# Patient Record
Sex: Female | Born: 1980 | Race: White | Hispanic: No | Marital: Married | Smoking: Former smoker
Health system: Southern US, Community
[De-identification: ages and names within clinical notes are randomized; demographics above are authoritative.]

## PROBLEM LIST (undated history)

## (undated) DIAGNOSIS — H8109 Meniere's disease, unspecified ear: Secondary | ICD-10-CM

## (undated) DIAGNOSIS — J45909 Unspecified asthma, uncomplicated: Secondary | ICD-10-CM

## (undated) DIAGNOSIS — R87619 Unspecified abnormal cytological findings in specimens from cervix uteri: Secondary | ICD-10-CM

## (undated) DIAGNOSIS — D249 Benign neoplasm of unspecified breast: Secondary | ICD-10-CM

## (undated) DIAGNOSIS — B977 Papillomavirus as the cause of diseases classified elsewhere: Secondary | ICD-10-CM

## (undated) HISTORY — DX: Meniere's disease, unspecified ear: H81.09

## (undated) HISTORY — DX: Unspecified asthma, uncomplicated: J45.909

## (undated) HISTORY — DX: Benign neoplasm of unspecified breast: D24.9

## (undated) HISTORY — DX: Unspecified abnormal cytological findings in specimens from cervix uteri: R87.619

## (undated) HISTORY — DX: Papillomavirus as the cause of diseases classified elsewhere: B97.7

## (undated) HISTORY — PX: LEEP: SHX91

---
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
## 2001-05-19 ENCOUNTER — Other Ambulatory Visit: Admission: RE | Admit: 2001-05-19 | Discharge: 2001-05-19 | Payer: Self-pay | Admitting: *Deleted

## 2002-08-25 ENCOUNTER — Ambulatory Visit (HOSPITAL_COMMUNITY): Admission: RE | Admit: 2002-08-25 | Discharge: 2002-08-25 | Payer: Self-pay | Admitting: Otolaryngology

## 2003-08-08 ENCOUNTER — Other Ambulatory Visit: Admission: RE | Admit: 2003-08-08 | Discharge: 2003-08-08 | Payer: Self-pay | Admitting: *Deleted

## 2004-05-14 ENCOUNTER — Encounter (INDEPENDENT_AMBULATORY_CARE_PROVIDER_SITE_OTHER): Payer: Self-pay | Admitting: *Deleted

## 2004-05-14 ENCOUNTER — Ambulatory Visit (HOSPITAL_COMMUNITY): Admission: RE | Admit: 2004-05-14 | Discharge: 2004-05-14 | Payer: Self-pay | Admitting: *Deleted

## 2004-10-30 ENCOUNTER — Other Ambulatory Visit: Admission: RE | Admit: 2004-10-30 | Discharge: 2004-10-30 | Payer: Self-pay | Admitting: *Deleted

## 2005-12-16 ENCOUNTER — Other Ambulatory Visit: Admission: RE | Admit: 2005-12-16 | Discharge: 2005-12-16 | Payer: Self-pay | Admitting: Obstetrics & Gynecology

## 2007-02-02 ENCOUNTER — Other Ambulatory Visit: Admission: RE | Admit: 2007-02-02 | Discharge: 2007-02-02 | Payer: Self-pay | Admitting: Obstetrics and Gynecology

## 2010-08-22 NOTE — Op Note (Signed)
Crystal Alexander, Crystal Alexander             ACCOUNT NO.:  [id number]   MEDICAL RECORD NO.:  [id number]          PATIENT TYPE:  OIB   LOCATION:  NA                           FACILITY:  [facility]   PHYSICIAN:  Vikki Ports, MDDATE OF BIRTH:  02/25/1981   DATE OF PROCEDURE:  05/14/2004  DATE OF DISCHARGE:                                 OPERATIVE REPORT   PREOPERATIVE DIAGNOSIS:  Right breast mass x2.   POSTOPERATIVE DIAGNOSIS:  Right breast mass x2.   PROCEDURE:  Excisional right breast biopsy x2.   ANESTHESIA:  General.   SPECIMENS:  Right breast biopsy x2.   COMPLICATIONS:  None.   DESCRIPTION OF PROCEDURE:  The patient was taken to the operating room and  placed in the supine position.  After adequate general anesthesia was  induced using laryngeal mask, the right breast was prepped and draped in the  usual sterile fashion.  Using a curvilinear incision in the areolar edge in  the 12 o'clock region of the right breast, I dissected down onto a firm,  well-encapsulated mass.  It was grasped with a 2-0 Vicryl suture and excised  from surrounding tissue.  It was sent for pathologic evaluation.  Adequate  hemostasis was ensured and the skin was closed with subcuticular 4-0  Monocryl.   I then turned my attention to the more superior portion of the breast.  At  12 o'clock three fingerbreadths away from the areola, there was a  bilobulated mass.  Incision was made over it, dissected down.  It was  grasped with a 2-0 Vicryl and delivered up and through the wound with  electrocautery and excised.  Adequate hemostasis was ensured and the skin  was closed with subcuticular 4-0 Monocryl.  Steri-Strips and sterile  dressings were applied.  The patient tolerated the procedure well and went  to PACU in good condition.      KRH/MEDQ  D:  05/14/2004  T:  05/14/2004  Job:  [id number]

## 2012-07-18 ENCOUNTER — Encounter: Payer: Self-pay | Admitting: Certified Nurse Midwife

## 2012-07-19 ENCOUNTER — Encounter: Payer: Self-pay | Admitting: Certified Nurse Midwife

## 2012-07-19 ENCOUNTER — Ambulatory Visit (INDEPENDENT_AMBULATORY_CARE_PROVIDER_SITE_OTHER): Payer: 59 | Admitting: Certified Nurse Midwife

## 2012-07-19 VITALS — BP 110/64 | Ht 65.75 in | Wt 178.0 lb

## 2012-07-19 DIAGNOSIS — Z01419 Encounter for gynecological examination (general) (routine) without abnormal findings: Secondary | ICD-10-CM

## 2012-07-19 DIAGNOSIS — Z0000 Encounter for general adult medical examination without abnormal findings: Secondary | ICD-10-CM

## 2012-07-19 LAB — POCT URINALYSIS DIPSTICK
Blood, UA: NEGATIVE
Nitrite, UA: NEGATIVE
Urobilinogen, UA: NEGATIVE
pH, UA: 5

## 2012-07-19 NOTE — Patient Instructions (Addendum)

## 2012-07-19 NOTE — Progress Notes (Signed)
32 y.o. SingleCaucasian female   G0P0000 here for annual exam. Periods are regular every month, start light and then increase to moderate amount. 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  Being followed by ENT.  Does not desire cycle control for periods. No health issues today.  No std concerns.  Contraception not needed due to female/female relationship. Aware of TDap being due, plans to update later not today.   Patient's last menstrual period was 06/28/2012.          Sexually active: yes  The current method of family planning is none.    Exercising: yes  cardio & strength training Last mammogram: u/s 2006 with biopsy rt side Last pap: 05-15-11  Last BMD: none Alcohol: none Tobacco: none   Health Maintenance  Topic Date Due  . Pap Smear  10/07/1998  . Tetanus/tdap  10/07/1999  . Influenza Vaccine  12/05/2012    Family History  Problem Relation Age of Onset  . Hypertension Mother   . Parkinson's disease Mother   . Diabetes Father   . Hypertension Paternal Grandmother     There is no problem list on file for this patient.   Past Medical History  Diagnosis Date  . Asthma   . Meniere's disease   . Fibroadenoma of breast      breast biopsy x 2  . HPV (human papilloma virus) infection     Past Surgical History  Procedure Laterality Date  . Leep      Allergies: Review of patient's allergies indicates no known allergies.  Current Outpatient Prescriptions  Medication Sig Dispense Refill  . Calcium Carbonate-Vitamin D (CALCIUM + D PO) Take by mouth daily.      . fluticasone (FLOVENT HFA) 110 MCG/ACT inhaler Inhale 1 puff into the lungs daily.       No current facility-administered medications for this visit.    ROS: A comprehensive review of systems was negative.  Exam:    BP 110/64  Ht 5' 5.75" (1.67 m)  Wt 178 lb (80.74 kg)  BMI 28.95 kg/m2  LMP 06/28/2012 Weight change: @WEIGHTCHANGE @ Last 3 height recordings:  Ht Readings from Last 3  Encounters:  07/19/12 5' 5.75" (1.67 m)   General appearance: alert, cooperative and appears stated age Head: atraumatic Neck: no adenopathy, supple, symmetrical, trachea midline and thyroid not enlarged, symmetric, no tenderness/mass/nodules Lungs: clear to auscultation bilaterally Breasts: normal appearance, no masses or tenderness Heart: regular rate and rhythm Abdomen: soft, non-tender; bowel sounds normal; no masses,  no organomegaly Extremities: extremities normal, atraumatic, no cyanosis or edema Skin: Skin color, texture, turgor normal. No rashes or lesions Lymph nodes: Cervical, supraclavicular, and axillary nodes normal. no inguinal nodes palpated Neurologic: Alert and oriented X 3, normal strength and tone. Normal symmetric reflexes. Normal coordination and gait   Pelvic: External genitalia:  no lesions              Urethra: normal appearing urethra with no masses, tenderness or lesions              Bartholins and Skenes: Bartholin's, Urethra, Skene's normal                 Vagina: normal appearing vagina with normal color and discharge, no lesions              Cervix: normal leep appearance              Pap taken: yes        Bimanual  Exam:  Uterus:  uterus is normal size, shape, consistency and nontender, anteverted                                      Adnexa:    normal adnexa in size, nontender and no masses                                      Rectovaginal: Confirms                                      Anus:  normal sphincter tone, no lesions  A: Well woman exam Contraception none needed female/female relationship History of CIN2 with Leep     P:Reviewed health and wellness pertinent to exam:  mammogram yearly and pap smear as indicated Lab: TSH  return annually or prn      An After Visit Summary was printed and given to the patient.  Reviewed, TL

## 2012-07-20 ENCOUNTER — Telehealth: Payer: Self-pay | Admitting: Certified Nurse Midwife

## 2012-07-20 LAB — TSH: TSH: 1.17 u[IU]/mL (ref 0.350–4.500)

## 2012-07-20 NOTE — Telephone Encounter (Signed)
Patient returning call.

## 2012-07-20 NOTE — Telephone Encounter (Signed)
Pt was notified of normal tsh results

## 2012-07-21 LAB — IPS PAP TEST WITH REFLEX TO HPV

## 2013-02-09 ENCOUNTER — Other Ambulatory Visit: Payer: Self-pay

## 2013-07-24 ENCOUNTER — Ambulatory Visit (INDEPENDENT_AMBULATORY_CARE_PROVIDER_SITE_OTHER): Payer: 59 | Admitting: Certified Nurse Midwife

## 2013-07-24 ENCOUNTER — Encounter: Payer: Self-pay | Admitting: Certified Nurse Midwife

## 2013-07-24 VITALS — BP 100/68 | HR 68 | Resp 16 | Ht 65.75 in | Wt 182.0 lb

## 2013-07-24 DIAGNOSIS — Z01419 Encounter for gynecological examination (general) (routine) without abnormal findings: Secondary | ICD-10-CM

## 2013-07-24 DIAGNOSIS — Z0000 Encounter for general adult medical examination without abnormal findings: Secondary | ICD-10-CM

## 2013-07-24 NOTE — Patient Instructions (Signed)
General topics  Next pap or exam is  due in 1 year Take a Women's multivitamin Take 1200 mg. of calcium daily - prefer dietary If any concerns in interim to call back  Breast Self-Awareness Practicing breast self-awareness may pick up problems early, prevent significant medical complications, and possibly save your life. By practicing breast self-awareness, you can become familiar with how your breasts look and feel and if your breasts are changing. This allows you to notice changes early. It can also offer you some reassurance that your breast health is good. One way to learn what is normal for your breasts and whether your breasts are changing is to do a breast self-exam. If you find a lump or something that was not present in the past, it is best to contact your caregiver right away. Other findings that should be evaluated by your caregiver include nipple discharge, especially if it is bloody; skin changes or reddening; areas where the skin seems to be pulled in (retracted); or new lumps and bumps. Breast pain is seldom associated with cancer (malignancy), but should also be evaluated by a caregiver. BREAST SELF-EXAM The best time to examine your breasts is 5 7 days after your menstrual period is over.  ExitCare Patient Information 2013 [employer].   Exercise to Stay Healthy Exercise helps you become and stay healthy. EXERCISE IDEAS AND TIPS Choose exercises that:  You enjoy.  Fit into your day. You do not need to exercise really hard to be healthy. You can do exercises at a slow or medium level and stay healthy. You can:  Stretch before and after working out.  Try yoga, Pilates, or tai chi.  Lift weights.  Walk fast, swim, jog, run, climb stairs, bicycle, dance, or rollerskate.  Take aerobic classes. Exercises that burn about 150 calories:  Running 1  miles in 15 minutes.  Playing volleyball for 45 to 60 minutes.  Washing and waxing a car for 45 to 60  minutes.  Playing touch football for 45 minutes.  Walking 1  miles in 35 minutes.  Pushing a stroller 1  miles in 30 minutes.  Playing basketball for 30 minutes.  Raking leaves for 30 minutes.  Bicycling 5 miles in 30 minutes.  Walking 2 miles in 30 minutes.  Dancing for 30 minutes.  Shoveling snow for 15 minutes.  Swimming laps for 20 minutes.  Walking up stairs for 15 minutes.  Bicycling 4 miles in 15 minutes.  Gardening for 30 to 45 minutes.  Jumping rope for 15 minutes.  Washing windows or floors for 45 to 60 minutes. Document Released: 04/25/2010 Document Revised: 06/15/2011 Document Reviewed: 04/25/2010 ExitCare Patient Information 2013 [employer].   Other topics ( that may be useful information):    Sexually Transmitted Disease Sexually transmitted disease (STD) refers to any infection that is passed from person to person during sexual activity. This may happen by way of saliva, semen, blood, vaginal mucus, or urine. Common STDs include:  Gonorrhea.  Chlamydia.  Syphilis.  HIV/AIDS.  Genital herpes.  Hepatitis B and C.  Trichomonas.  Human papillomavirus (HPV).  Pubic lice. CAUSES  An STD may be spread by bacteria, virus, or parasite. A person can get an STD by:  Sexual intercourse with an infected person.  Sharing sex toys with an infected person.  Sharing needles with an infected person.  Having intimate contact with the genitals, mouth, or rectal areas of an infected person. SYMPTOMS  Some people may not have any symptoms, but   they can still pass the infection to others. Different STDs have different symptoms. Symptoms include:  Painful or bloody urination.  Pain in the pelvis, abdomen, vagina, anus, throat, or eyes.  Skin rash, itching, irritation, growths, or sores (lesions). These usually occur in the genital or anal area.  Abnormal vaginal discharge.  Penile discharge in men.  Soft, flesh-colored skin growths in the  genital or anal area.  Fever.  Pain or bleeding during sexual intercourse.  Swollen glands in the groin area.  Yellow skin and eyes (jaundice). This is seen with hepatitis. DIAGNOSIS  To make a diagnosis, your caregiver may:  Take a medical history.  Perform a physical exam.  Take a specimen (culture) to be examined.  Examine a sample of discharge under a microscope.  Perform blood test TREATMENT   Chlamydia, gonorrhea, trichomonas, and syphilis can be cured with antibiotic medicine.  Genital herpes, hepatitis, and HIV can be treated, but not cured, with prescribed medicines. The medicines will lessen the symptoms.  Genital warts from HPV can be treated with medicine or by freezing, burning (electrocautery), or surgery. Warts may come back.  HPV is a virus and cannot be cured with medicine or surgery.However, abnormal areas may be followed very closely by your caregiver and may be removed from the cervix, vagina, or vulva through office procedures or surgery. If your diagnosis is confirmed, your recent sexual partners need treatment. This is true even if they are symptom-free or have a negative culture or evaluation. They should not have sex until their caregiver says it is okay. HOME CARE INSTRUCTIONS  All sexual partners should be informed, tested, and treated for all STDs.  Take your antibiotics as directed. Finish them even if you start to feel better.  Only take over-the-counter or prescription medicines for pain, discomfort, or fever as directed by your caregiver.  Rest.  Eat a balanced diet and drink enough fluids to keep your urine clear or pale yellow.  Do not have sex until treatment is completed and you have followed up with your caregiver. STDs should be checked after treatment.  Keep all follow-up appointments, Pap tests, and blood tests as directed by your caregiver.  Only use latex condoms and water-soluble lubricants during sexual activity. Do not use  petroleum jelly or oils.  Avoid alcohol and illegal drugs.  Get vaccinated for HPV and hepatitis. If you have not received these vaccines in the past, talk to your caregiver about whether one or both might be right for you.  Avoid risky sex practices that can break the skin. The only way to avoid getting an STD is to avoid all sexual activity.Latex condoms and dental dams (for oral sex) will help lessen the risk of getting an STD, but will not completely eliminate the risk. SEEK MEDICAL CARE IF:   You have a fever.  You have any new or worsening symptoms. Document Released: 06/13/2002 Document Revised: 06/15/2011 Document Reviewed: 06/20/2010 [facility] Patient Information 2013 [place].    Domestic Abuse You are being battered or abused if someone close to you hits, pushes, or physically hurts you in any way. You also are being abused if you are forced into activities. You are being sexually abused if you are forced to have sexual contact of any kind. You are being emotionally abused if you are made to feel worthless or if you are constantly threatened. It is important to remember that help is available. No one has the right to abuse you. PREVENTION OF FURTHER  ABUSE  Learn the warning signs of danger. This varies with situations but may include: the use of alcohol, threats, isolation from friends and family, or forced sexual contact. Leave if you feel that violence is going to occur.  If you are attacked or beaten, report it to the police so the abuse is documented. You do not have to press charges. The police can protect you while you or the attackers are leaving. Get the officer's name and badge number and a copy of the report.  Find someone you can trust and tell them what is happening to you: your caregiver, a nurse, clergy member, close friend or family member. Feeling ashamed is natural, but remember that you have done nothing wrong. No one deserves abuse. Document Released:  03/20/2000 Document Revised: 06/15/2011 Document Reviewed: 05/29/2010 ExitCare Patient Information 2013 [employer].    How Much is Too Much Alcohol? Drinking too much alcohol can cause injury, accidents, and health problems. These types of problems can include:   Car crashes.  Falls.  Family fighting (domestic violence).  Drowning.  Fights.  Injuries.  Burns.  Damage to certain organs.  Having a baby with birth defects. ONE DRINK CAN BE TOO MUCH WHEN YOU ARE:  Working.  Pregnant or breastfeeding.  Taking medicines. Ask your doctor.  Driving or planning to drive. If you or someone you know has a drinking problem, get help from a doctor.  Document Released: 01/17/2009 Document Revised: 06/15/2011 Document Reviewed: 01/17/2009 ExitCare Patient Information 2013 [employer].   Smoking Hazards Smoking cigarettes is extremely bad for your health. Tobacco smoke has over 200 known poisons in it. There are over 60 chemicals in tobacco smoke that cause cancer. Some of the chemicals found in cigarette smoke include:   Cyanide.  Benzene.  Formaldehyde.  Methanol (wood alcohol).  Acetylene (fuel used in welding torches).  Ammonia. Cigarette smoke also contains the poisonous gases nitrogen oxide and carbon monoxide.  Cigarette smokers have an increased risk of many serious medical problems and Smoking causes approximately:  90% of all lung cancer deaths in men.  80% of all lung cancer deaths in women.  90% of deaths from chronic obstructive lung disease. Compared with nonsmokers, smoking increases the risk of:  Coronary heart disease by 2 to 4 times.  Stroke by 2 to 4 times.  Men developing lung cancer by 23 times.  Women developing lung cancer by 13 times.  Dying from chronic obstructive lung diseases by 12 times.  . Smoking is the most preventable cause of death and disease in our society.  WHY IS SMOKING ADDICTIVE?  Nicotine is the chemical  agent in tobacco that is capable of causing addiction or dependence.  When you smoke and inhale, nicotine is absorbed rapidly into the bloodstream through your lungs. Nicotine absorbed through the lungs is capable of creating a powerful addiction. Both inhaled and non-inhaled nicotine may be addictive.  Addiction studies of cigarettes and spit tobacco show that addiction to nicotine occurs mainly during the teen years, when young people begin using tobacco products. WHAT ARE THE BENEFITS OF QUITTING?  There are many health benefits to quitting smoking.   Likelihood of developing cancer and heart disease decreases. Health improvements are seen almost immediately.  Blood pressure, pulse rate, and breathing patterns start returning to normal soon after quitting. QUITTING SMOKING   American Lung Association - 1-800-LUNGUSA  American Cancer Society - 1-800-ACS-2345 Document Released: 04/30/2004 Document Revised: 06/15/2011 Document Reviewed: 01/02/2009 ExitCare Patient Information 2013 ExitCare,   LLC.   Stress Management Stress is a state of physical or mental tension that often results from changes in your life or normal routine. Some common causes of stress are:  Death of a loved one.  Injuries or severe illnesses.  Getting fired or changing jobs.  Moving into a new home. Other causes may be:  Sexual problems.  Business or financial losses.  Taking on a large debt.  Regular conflict with someone at home or at work.  Constant tiredness from lack of sleep. It is not just bad things that are stressful. It may be stressful to:  Win the lottery.  Get married.  Buy a new car. The amount of stress that can be easily tolerated varies from person to person. Changes generally cause stress, regardless of the types of change. Too much stress can affect your health. It may lead to physical or emotional problems. Too little stress (boredom) may also become stressful. SUGGESTIONS TO  REDUCE STRESS:  Talk things over with your family and friends. It often is helpful to share your concerns and worries. If you feel your problem is serious, you may want to get help from a professional counselor.  Consider your problems one at a time instead of lumping them all together. Trying to take care of everything at once may seem impossible. List all the things you need to do and then start with the most important one. Set a goal to accomplish 2 or 3 things each day. If you expect to do too many in a single day you will naturally fail, causing you to feel even more stressed.  Do not use alcohol or drugs to relieve stress. Although you may feel better for a short time, they do not remove the problems that caused the stress. They can also be habit forming.  Exercise regularly - at least 3 times per week. Physical exercise can help to relieve that "uptight" feeling and will relax you.  The shortest distance between despair and hope is often a good night's sleep.  Go to bed and get up on time allowing yourself time for appointments without being rushed.  Take a short "time-out" period from any stressful situation that occurs during the day. Close your eyes and take some deep breaths. Starting with the muscles in your face, tense them, hold it for a few seconds, then relax. Repeat this with the muscles in your neck, shoulders, hand, stomach, back and legs.  Take good care of yourself. Eat a balanced diet and get plenty of rest.  Schedule time for having fun. Take a break from your daily routine to relax. HOME CARE INSTRUCTIONS   Call if you feel overwhelmed by your problems and feel you can no longer manage them on your own.  Return immediately if you feel like hurting yourself or someone else. Document Released: 09/16/2000 Document Revised: 06/15/2011 Document Reviewed: 05/09/2007 [facility] Patient Information 2013 [place].  Have fun in [place]!! Debbi

## 2013-07-24 NOTE — Progress Notes (Signed)
33 y.o. G0P0000 Single Caucasian Fe here for annual exam. Periods normal, still long at 8-9 days. No partner change. No STD concerns. Getting ready for trip [place]! Had mastitis in right breast a few months ago. ? Piercing possible cause. Patient has removed ring in nipple now and has had no other problems. Recent visit with PCP for labs and aex, all normal per patient. Hearing loss stable now, no progression with Meniere's disease. No health issues today.  Patient's last menstrual period was 07/13/2013.          Sexually active: yes  The current method of family planning is none,female partner.    Exercising: yes  cardio Smoker:  no  Health Maintenance: Pap:  07-19-12 neg MMG:  U/s 2005 Colonoscopy:  none BMD:   none TDaP: 2007 Labs: none Self breast exam: done occ   reports that she has quit smoking. Her smoking use included Cigarettes. She smoked 0.00 packs per day. She does not have any smokeless tobacco history on file. She reports that she drinks about 1.5 ounces of alcohol per week. She reports that she does not use illicit drugs.  Past Medical History  Diagnosis Date  . Asthma   . Meniere's disease   . Fibroadenoma of breast      breast biopsy x 2  . HPV (human papilloma virus) infection   . Abnormal Pap smear of cervix     3/99 cin2 &3    Past Surgical History  Procedure Laterality Date  . Leep    . Cervical biopsy  w/ loop electrode excision  1999    negative margins    Current Outpatient Prescriptions  Medication Sig Dispense Refill  . Cholecalciferol (VITAMIN D PO) Take by mouth daily.      . fluticasone (FLOVENT HFA) 110 MCG/ACT inhaler Inhale 1 puff into the lungs daily.      . TURMERIC PO Take by mouth daily.      Marland Kitchen UNABLE TO FIND daily. Asthma clear       No current facility-administered medications for this visit.    Family History  Problem Relation Age of Onset  . Hypertension Mother   . Parkinson's disease Mother   . Diabetes Father   .  Hypertension Paternal Grandmother     ROS:  Pertinent items are noted in HPI.  Otherwise, a comprehensive ROS was negative.  Exam:   BP 100/68  Pulse 68  Resp 16  Ht 5' 5.75" (1.67 m)  Wt 182 lb (82.555 kg)  BMI 29.60 kg/m2  LMP 07/13/2013 Height: 5' 5.75" (167 cm)  Ht Readings from Last 3 Encounters:  07/24/13 5' 5.75" (1.67 m)  07/19/12 5' 5.75" (1.67 m)    General appearance: alert, cooperative and appears stated age Head: Normocephalic, without obvious abnormality, atraumatic Neck: no adenopathy, supple, symmetrical, trachea midline and thyroid normal to inspection and palpation and non-palpable Lungs: clear to auscultation bilaterally Breasts: normal appearance, no masses or tenderness, No nipple retraction or dimpling, No nipple discharge or bleeding, No axillary or supraclavicular adenopathy Heart: regular rate and rhythm Abdomen: soft, non-tender; no masses,  no organomegaly Extremities: extremities normal, atraumatic, no cyanosis or edema Skin: Skin color, texture, turgor normal. No rashes or lesions Lymph nodes: Cervical, supraclavicular, and axillary nodes normal. No abnormal inguinal nodes palpated Neurologic: Grossly normal   Pelvic: External genitalia:  no lesions              Urethra:  normal appearing urethra with no masses, tenderness or  lesions              Bartholin's and Skene's: normal                 Vagina: normal appearing vagina with normal color and discharge, no lesions              Cervix: normal, non tender              Pap taken: yes Bimanual Exam:  Uterus:  normal size, contour, position, consistency, mobility, non-tender and anteverted              Adnexa: normal adnexa and no mass, fullness, tenderness               Rectovaginal: Confirms               Anus:  Deferred,requested  A:  Well Woman with normal exam  Contraception none needed due to female/female relationship  Meniere's Disease with partial hearing loss  History of recent right  breast mastitis with no complications  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   pap smear taken today with HPVHR  counseled on breast self exam, mammography screening, STD prevention, HIV risk factors,Hepatitis C risk factors and prevention, adequate intake of calcium and vitamin D, diet and exercise  return annually or prn  An After Visit Summary was printed and given to the patient.

## 2013-07-27 LAB — IPS PAP TEST WITH HPV

## 2013-07-31 NOTE — Progress Notes (Signed)
Reviewed personally.  M. Suzanne Hoby Kawai, MD.  

## 2014-07-31 ENCOUNTER — Ambulatory Visit (INDEPENDENT_AMBULATORY_CARE_PROVIDER_SITE_OTHER): Payer: 59 | Admitting: Certified Nurse Midwife

## 2014-07-31 ENCOUNTER — Encounter: Payer: Self-pay | Admitting: Certified Nurse Midwife

## 2014-07-31 VITALS — BP 108/76 | HR 68 | Resp 16 | Ht 66.5 in | Wt 196.0 lb

## 2014-07-31 DIAGNOSIS — Z0000 Encounter for general adult medical examination without abnormal findings: Secondary | ICD-10-CM | POA: Diagnosis not present

## 2014-07-31 DIAGNOSIS — Z01419 Encounter for gynecological examination (general) (routine) without abnormal findings: Secondary | ICD-10-CM

## 2014-07-31 DIAGNOSIS — Z124 Encounter for screening for malignant neoplasm of cervix: Secondary | ICD-10-CM

## 2014-07-31 DIAGNOSIS — Z8742 Personal history of other diseases of the female genital tract: Secondary | ICD-10-CM

## 2014-07-31 LAB — CBC
HEMATOCRIT: 43.7 % (ref 36.0–46.0)
HEMOGLOBIN: 14.4 g/dL (ref 12.0–15.0)
MCH: 30 pg (ref 26.0–34.0)
MCHC: 33 g/dL (ref 30.0–36.0)
MCV: 91 fL (ref 78.0–100.0)
MPV: 10.2 fL (ref 8.6–12.4)
Platelets: 352 10*3/uL (ref 150–400)
RBC: 4.8 MIL/uL (ref 3.87–5.11)
RDW: 14.3 % (ref 11.5–15.5)
WBC: 7.9 10*3/uL (ref 4.0–10.5)

## 2014-07-31 LAB — POCT URINALYSIS DIPSTICK
Bilirubin, UA: NEGATIVE
Glucose, UA: NEGATIVE
Ketones, UA: NEGATIVE
LEUKOCYTES UA: NEGATIVE
Nitrite, UA: NEGATIVE
Protein, UA: NEGATIVE
UROBILINOGEN UA: NEGATIVE
pH, UA: 6

## 2014-07-31 NOTE — Progress Notes (Signed)
Reviewed personally.  M. Suzanne Merit Gadsby, MD.  

## 2014-07-31 NOTE — Progress Notes (Signed)
Reviewed personally.  M. Suzanne Jazon Jipson, MD.  

## 2014-07-31 NOTE — Patient Instructions (Signed)

## 2014-07-31 NOTE — Progress Notes (Signed)
34 y.o. G0P0000 Married  Caucasian Fe here for annual exam. Periods normal, with occasional period, normal for her. No partner change. Sees PCP prn for asthma.  No other health issues today. Went to [place] and [place]!   Patient's last menstrual period was 07/24/2014.          Sexually active: Yes.    The current method of family planning is none - female partner.     Exercising: Yes.    Cardio, strength  Smoker:  no  Health Maintenance: Pap: 07/24/13 Neg. 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 On menses.    reports that she has quit smoking. Her smoking use included Cigarettes. She has never used smokeless tobacco. She reports that she drinks about 1.5 oz of alcohol per week. She reports that she does not use illicit drugs.  Past Medical History  Diagnosis Date  . Asthma   . Meniere's disease   . Fibroadenoma of breast      breast biopsy x 2  . HPV (human papilloma virus) infection   . Abnormal Pap smear of cervix     3/99 cin2 &3    Past Surgical History  Procedure Laterality Date  . Leep    . Cervical biopsy  w/ loop electrode excision  1999    negative margins    Current Outpatient Prescriptions  Medication Sig Dispense Refill  . Calcium Carb-Cholecalciferol (CALCIUM 1000 + D PO) Take by mouth daily.    . Cholecalciferol (VITAMIN D PO) Take by mouth daily.    . fluticasone (FLOVENT HFA) 110 MCG/ACT inhaler Inhale 1 puff into the lungs daily.    Marland Kitchen glucosamine-chondroitin 500-400 MG tablet Take 1 tablet by mouth 3 (three) times daily.    . Maca Root POWD by Does not apply route daily.    . TURMERIC PO Take by mouth daily.    Marland Kitchen UNABLE TO FIND daily. Asthma clear     No current facility-administered medications for this visit.    Family History  Problem Relation Age of Onset  . Hypertension Mother   . Parkinson's disease Mother   . Diabetes Father   . Hypertension  Paternal Grandmother     ROS:  Pertinent items are noted in HPI.  Otherwise, a comprehensive ROS was negative.  Exam:   BP 108/76 mmHg  Pulse 68  Resp 16  Ht 5' 6.5" (1.689 m)  Wt 196 lb (88.905 kg)  BMI 31.16 kg/m2  LMP 07/24/2014 Height: 5' 6.5" (168.9 cm) Ht Readings from Last 3 Encounters:  07/31/14 5' 6.5" (1.689 m)  07/24/13 5' 5.75" (1.67 m)  07/19/12 5' 5.75" (1.67 m)    General appearance: alert, cooperative and appears stated age Head: Normocephalic, without obvious abnormality, atraumatic Neck: no adenopathy, supple, symmetrical, trachea midline and thyroid normal to inspection and palpation Lungs: clear to auscultation bilaterally Breasts: normal appearance, no masses or tenderness, No nipple retraction or dimpling, No nipple discharge or bleeding, No axillary or supraclavicular adenopathy Heart: regular rate and rhythm Abdomen: soft, non-tender; no masses,  no organomegaly Extremities: extremities normal, atraumatic, no cyanosis or edema Skin: Skin color, texture, turgor normal. No rashes or lesions, numerous tattoos Lymph nodes: Cervical, supraclavicular, and axillary nodes normal. No abnormal inguinal nodes palpated Neurologic: Grossly normal   Pelvic: External genitalia:  no lesions  Urethra:  normal appearing urethra with no masses, tenderness or lesions              Bartholin's and Skene's: normal                 Vagina: normal appearing vagina with normal color and discharge, no lesions              Cervix: normal, non tender, no lesions, LEEP appearance              Pap taken: Yes.   Bimanual Exam:  Uterus:  normal size, contour, position, consistency, mobility, non-tender              Adnexa: normal adnexa and no mass, fullness, tenderness               Rectovaginal: Confirms               Anus:  normal appearance  Chaperone present: Yes  A:  Well Woman with normal exam  Contraception female relationship  Screening labs  desired  History of abnormal pap CIN 2,3 with LEEP  P:   Reviewed health and wellness pertinent to exam  Lab: Lipid panel, Hep C  Pap smear taken today with HPV reflex   counseled on breast self exam, adequate intake of calcium and vitamin D, diet and exercise  return annually or prn  An After Visit Summary was printed and given to the patient.

## 2014-08-01 LAB — LIPID PANEL
CHOL/HDL RATIO: 3.5 ratio
Cholesterol: 202 mg/dL — ABNORMAL HIGH (ref 0–200)
HDL: 58 mg/dL (ref >=46)
LDL CALC: 127 mg/dL — AB (ref 0–99)
Triglycerides: 86 mg/dL (ref <150)
VLDL: 17 mg/dL (ref 0–40)

## 2014-08-01 LAB — HEPATITIS C ANTIBODY: HCV AB: NEGATIVE

## 2014-08-01 LAB — HEMOGLOBIN, FINGERSTICK: HEMOGLOBIN, FINGERSTICK: 14.4 g/dL (ref 12.0–16.0)

## 2014-08-02 LAB — IPS PAP TEST WITH REFLEX TO HPV

## 2015-08-07 ENCOUNTER — Ambulatory Visit (INDEPENDENT_AMBULATORY_CARE_PROVIDER_SITE_OTHER): Payer: 59 | Admitting: Certified Nurse Midwife

## 2015-08-07 ENCOUNTER — Encounter: Payer: Self-pay | Admitting: Certified Nurse Midwife

## 2015-08-07 VITALS — BP 116/70 | HR 70 | Resp 16 | Ht 66.5 in | Wt 203.0 lb

## 2015-08-07 DIAGNOSIS — L689 Hypertrichosis, unspecified: Secondary | ICD-10-CM | POA: Diagnosis not present

## 2015-08-07 DIAGNOSIS — Z124 Encounter for screening for malignant neoplasm of cervix: Secondary | ICD-10-CM | POA: Diagnosis not present

## 2015-08-07 DIAGNOSIS — Z01419 Encounter for gynecological examination (general) (routine) without abnormal findings: Secondary | ICD-10-CM

## 2015-08-07 DIAGNOSIS — N912 Amenorrhea, unspecified: Secondary | ICD-10-CM | POA: Diagnosis not present

## 2015-08-07 DIAGNOSIS — Z0000 Encounter for general adult medical examination without abnormal findings: Secondary | ICD-10-CM

## 2015-08-07 LAB — TSH: TSH: 2.26 m[IU]/L

## 2015-08-07 LAB — POCT URINALYSIS DIPSTICK
Bilirubin, UA: NEGATIVE
Blood, UA: NEGATIVE
Glucose, UA: NEGATIVE
Ketones, UA: NEGATIVE
LEUKOCYTES UA: NEGATIVE
Nitrite, UA: NEGATIVE
PH UA: 5
PROTEIN UA: NEGATIVE
UROBILINOGEN UA: NEGATIVE

## 2015-08-07 LAB — LIPID PANEL
Cholesterol: 235 mg/dL — ABNORMAL HIGH (ref 125–200)
HDL: 54 mg/dL (ref >=46)
LDL CALC: 153 mg/dL — AB (ref <130)
Total CHOL/HDL Ratio: 4.4 Ratio (ref <=5.0)
Triglycerides: 142 mg/dL (ref <150)
VLDL: 28 mg/dL (ref <30)

## 2015-08-07 NOTE — Patient Instructions (Signed)
EXERCISE AND DIET:  We recommended that you start or continue a regular exercise program for good health. Regular exercise means any activity that makes your heart beat faster and makes you sweat.  We recommend exercising at least 30 minutes per day at least 3 days a week, preferably 4 or 5.  We also recommend a diet low in fat and sugar.  Inactivity, poor dietary choices and obesity can cause diabetes, heart attack, stroke, and kidney damage, among others.    ALCOHOL AND SMOKING:  Women should limit their alcohol intake to no more than 7 drinks/beers/glasses of wine (combined, not each!) per week. Moderation of alcohol intake to this level decreases your risk of breast cancer and liver damage. And of course, no recreational drugs are part of a healthy lifestyle.  And absolutely no smoking or even second hand smoke. Most people know smoking can cause heart and lung diseases, but did you know it also contributes to weakening of your bones? Aging of your skin?  Yellowing of your teeth and nails?  CALCIUM AND VITAMIN D:  Adequate intake of calcium and Vitamin D are recommended.  The recommendations for exact amounts of these supplements seem to change often, but generally speaking 600 mg of calcium (either carbonate or citrate) and 800 units of Vitamin D per day seems prudent. Certain women may benefit from higher intake of Vitamin D.  If you are among these women, your doctor will have told you during your visit.    PAP SMEARS:  Pap smears, to check for cervical cancer or precancers,  have traditionally been done yearly, although recent scientific advances have shown that most women can have pap smears less often.  However, every woman still should have a physical exam from her gynecologist every year. It will include a breast check, inspection of the vulva and vagina to check for abnormal growths or skin changes, a visual exam of the cervix, and then an exam to evaluate the size and shape of the uterus and  ovaries.  And after 35 years of age, a rectal exam is indicated to check for rectal cancers. We will also provide age appropriate advice regarding health maintenance, like when you should have certain vaccines, screening for sexually transmitted diseases, bone density testing, colonoscopy, mammograms, etc.   MAMMOGRAMS:  All women over 40 years old should have a yearly mammogram. Many facilities now offer a "3D" mammogram, which may cost around $50 extra out of pocket. If possible,  we recommend you accept the option to have the 3D mammogram performed.  It both reduces the number of women who will be called back for extra views which then turn out to be normal, and it is better than the routine mammogram at detecting truly abnormal areas.    COLONOSCOPY:  Colonoscopy to screen for colon cancer is recommended for all women at age 50.  We know, you hate the idea of the prep.  We agree, BUT, having colon cancer and not knowing it is worse!!  Colon cancer so often starts as a polyp that can be seen and removed at colonscopy, which can quite literally save your life!  And if your first colonoscopy is normal and you have no family history of colon cancer, most women don't have to have it again for 10 years.  Once every ten years, you can do something that may end up saving your life, right?  We will be happy to help you get it scheduled when you are ready.    Be sure to check your insurance coverage so you understand how much it will cost.  It may be covered as a preventative service at no cost, but you should check your particular policy.     Secondary Amenorrhea Secondary amenorrhea is the stopping of menstrual flow for 3-6 months in a female who has previously had periods. There are many possible causes. Most of these causes are not serious. Usually, treating the underlying problem causing the loss of menses will return your periods to normal. CAUSES  Some common and uncommon causes of not menstruating  include:  Malnutrition.  Low blood sugar (hypoglycemia).  Polycystic ovary disease.  Stress or fear.  Breastfeeding.  Hormone imbalance.  Ovarian failure.  Medicines.  Extreme obesity.  Cystic fibrosis.  Low body weight or drastic weight reduction from any cause.  Early menopause.  Removal of ovaries or uterus.  Contraceptives.  Illness.  Long-term (chronic) illnesses.  Cushing syndrome.  Thyroid problems.  Birth control pills, patches, or vaginal rings for birth control. RISK FACTORS You may be at greater risk of secondary amenorrhea if:  You have a family history of this condition.  You have an eating disorder.  You do athletic training. DIAGNOSIS  A diagnosis is made by your health care provider taking a medical history and doing a physical exam. This will include a pelvic exam to check for problems with your reproductive organs. Pregnancy must be ruled out. Often, numerous blood tests are done to measure different hormones in the body. Urine testing may be done. Specialized exams (ultrasound, CT scan, MRI, or hysteroscopy) may have to be done as well as measuring the body mass index (BMI). TREATMENT  Treatment depends on the cause of the amenorrhea. If an eating disorder is present, this can be treated with an adequate diet and therapy. Chronic illnesses may improve with treatment of the illness. Amenorrhea may be corrected with medicines, lifestyle changes, or surgery. If the amenorrhea cannot be corrected, it is sometimes possible to create a false menstruation with medicines. HOME CARE INSTRUCTIONS  Maintain a healthy diet.  Manage weight problems.  Exercise regularly but not excessively.  Get adequate sleep.  Manage stress.  Be aware of changes in your menstrual cycle. Keep a record of when your periods occur. Note the date your period starts, how long it lasts, and any problems. SEEK MEDICAL CARE IF: Your symptoms do not get better with  treatment.   This information is not intended to replace advice given to you by your health care provider. Make sure you discuss any questions you have with your health care provider.   Document Released: 05/04/2006 Document Revised: 04/13/2014 Document Reviewed: 09/08/2012 Elsevier Interactive Patient Education [employer].

## 2015-08-07 NOTE — Progress Notes (Signed)
Reviewed personally.  M. Suzanne Josceline Chenard, MD.  

## 2015-08-07 NOTE — Progress Notes (Signed)
35 y.o. G0P0000 Married  Caucasian Fe here for annual exam. Periods history of irregular period. In last 6 months has occurred 3 times.  Working on diet with wife now. Sees PCP as needed. Desires screening labs today. Wife has noted small area in vagina that feels like small bb or pea please check. Patient has not noted any changes. Aware she has gained weight and that can also affect her periods. No other health issues today. Took trip to [place]!  Patient's last menstrual period was 05/16/2015 (lmp unknown).          Sexually active: Yes.    The current method of family planning is none, female partner.    Exercising: Yes.    cardio & weights Smoker:  no  Health Maintenance: Pap:  07-31-14 neg Hx of CINII & III-Leep MMG:  none Colonoscopy:  none BMD:   none TDaP:  2007, pt wants update today Shingles: no Pneumonia: no Hep C and HIV: Hep c neg 2016, HIV neg yrs ago Labs: poct urine-neg Self breast exam: done occ   reports that she has quit smoking. Her smoking use included Cigarettes. She has never used smokeless tobacco. She reports that she drinks about 0.6 - 1.2 oz of alcohol per week. She reports that she does not use illicit drugs.  Past Medical History  Diagnosis Date  . Asthma   . Meniere's disease   . Fibroadenoma of breast      breast biopsy x 2  . HPV (human papilloma virus) infection   . Abnormal Pap smear of cervix     3/99 cin2 &3    Past Surgical History  Procedure Laterality Date  . Leep    . Cervical biopsy  w/ loop electrode excision  1999    negative margins    Current Outpatient Prescriptions  Medication Sig Dispense Refill  . Cholecalciferol (VITAMIN D PO) Take by mouth daily.    . fluticasone (FLOVENT HFA) 110 MCG/ACT inhaler Inhale 1 puff into the lungs daily.    Marland Kitchen glucosamine-chondroitin 500-400 MG tablet Take 1 tablet by mouth 3 (three) times daily.    Blanchie Dessert Chiro-Inositol (OVASITOL PO) Take by mouth 2 (two) times daily.    . Maca Root  POWD by Does not apply route daily.    . TURMERIC PO Take by mouth daily.    Marland Kitchen UNABLE TO FIND daily. Asthma clear     No current facility-administered medications for this visit.    Family History  Problem Relation Age of Onset  . Hypertension Mother   . Parkinson's disease Mother   . Diabetes Father   . Hypertension Paternal Grandmother     ROS:  Pertinent items are noted in HPI.  Otherwise, a comprehensive ROS was negative.  Exam:   BP 116/70 mmHg  Pulse 70  Resp 16  Ht 5' 6.5" (1.689 m)  Wt 203 lb (92.08 kg)  BMI 32.28 kg/m2  LMP 05/16/2015 (LMP Unknown) Height: 5' 6.5" (168.9 cm) Ht Readings from Last 3 Encounters:  08/07/15 5' 6.5" (1.689 m)  07/31/14 5' 6.5" (1.689 m)  07/24/13 5' 5.75" (1.67 m)    General appearance: alert, cooperative and appears stated age Head: Normocephalic, without obvious abnormality, atraumatic Neck: no adenopathy, supple, symmetrical, trachea midline and thyroid normal to inspection and palpation Lungs: clear to auscultation bilaterally Breasts: normal appearance, no masses or tenderness, No nipple retraction or dimpling, No nipple discharge or bleeding, No axillary or supraclavicular adenopathy Heart: regular rate and rhythm  Abdomen: soft, non-tender; no masses,  no organomegaly Extremities: extremities normal, atraumatic, no cyanosis or edema Skin: Skin color, texture, turgor normal. No rashes or lesions Lymph nodes: Cervical, supraclavicular, and axillary nodes normal. No abnormal inguinal nodes palpated Neurologic: Grossly normal   Pelvic: External genitalia:  no lesions              Urethra:  normal appearing urethra with no masses, tenderness or lesions              Bartholin's and Skene's: normal                 Vagina: normal appearing vagina with normal color and discharge, no lesions, small area at 11 o'clock noted in upper vagina rugae appearance normal appearance, non tender              Cervix: normal,non tender, LEEP  appearance, no lesions              Pap taken: Yes.   Bimanual Exam:  Uterus:  normal size, contour, position, consistency, mobility, non-tender              Adnexa: normal adnexa and no mass, fullness, tenderness               Rectovaginal: Confirms               Anus:  normal sphincter tone, no lesions  Chaperone present: yes  A:  Well Woman with normal exam  Contraception none needed female partner  Amenorrhea  Increase facial hair growth  History of irregular cycles  History of CIN 2,3 with LEEP age 40  Screening labs  P:   Reviewed health and wellness pertinent to exam  Discussed need to evaluate amenorrhea even though history of, with no menses in past 3 months. Discussed thyroid, weight, and pituitary influence of menses. Questions addressed. Agreeable to labs. Discussed possible Provera challenge if no period by first of June. Patient  Agreeable and will advise.  Lab: TSH,FSH,Prolactin  Discussed this may also be a part of irregular cycle. Would recommend labs also to check. Patient has had laser removal with reoccurrence. Questions addressed as to etiology. Discussed family history,elevated testosterone level and response to chemicals can cause changes.  Lab: Testosterone, DHEA  Pap smear as above with HPVHR  Labs: Lipid panel, Vitamin D   counseled on breast self exam, adequate intake of calcium and vitamin D, diet and exercise  return annually or prn  An After Visit Summary was printed and given to the patient.

## 2015-08-08 LAB — PROLACTIN: PROLACTIN: 13.1 ng/mL

## 2015-08-08 LAB — DHEA-SULFATE: DHEA-SO4: 168 ug/dL (ref 23–266)

## 2015-08-08 LAB — TESTOSTERONE: TESTOSTERONE: 102 ng/dL

## 2015-08-08 LAB — FOLLICLE STIMULATING HORMONE: FSH: 8.6 m[IU]/mL

## 2015-08-09 LAB — TESTOSTERONE, TOTAL, LC/MS/MS: TESTOSTERONE, TOTAL, LC-MS-MS: 61 ng/dL — AB (ref 2–45)

## 2015-08-09 LAB — IPS PAP TEST WITH HPV

## 2015-08-16 ENCOUNTER — Other Ambulatory Visit: Payer: Self-pay | Admitting: Certified Nurse Midwife

## 2015-08-16 DIAGNOSIS — L689 Hypertrichosis, unspecified: Secondary | ICD-10-CM

## 2015-08-19 ENCOUNTER — Telehealth: Payer: Self-pay | Admitting: Certified Nurse Midwife

## 2015-08-19 DIAGNOSIS — L689 Hypertrichosis, unspecified: Secondary | ICD-10-CM

## 2015-08-19 NOTE — Telephone Encounter (Addendum)
Spoke with patient. Advised of message as seen below from [place] She is agreeable and verbalizes understanding. Advised she will need to be seen in the office for additional lab work. She is agreeable. Appointment scheduled for 08/21/2015 at 9 am. She is agreeable to date and time. Advised I will let Melvia Heaps CNM know that we have spoken and if she has any additional recommendations I will return call tomorrow. She is agreeable and verbalizes understanding.  Cc: Melvia Heaps CNM

## 2015-08-19 NOTE — Telephone Encounter (Signed)
Agree with plan 

## 2015-08-19 NOTE — Telephone Encounter (Signed)
Patient left a message on Friday stating she was returning a call to [place]. No open telephone notes and this is Regina Eck, CNM patient?

## 2015-08-19 NOTE — Telephone Encounter (Signed)
Please let the patient know that Jackelyn Poling will be back in the office tomorrow. She has a slightly elevated testosterone level (not worrisome), I suspect she has PCOS with her oligomenorrhea, hair growth and increased weight. It looks like Debbie added a 17 hydroxyprogesterone. Her lipids were slightly abnormal. She should have a HgbA1C ordered as well. PCOS increases her risk of diabetes. Please order or add this on.  She should eat a diet low in saturated fats and exercise regularly. Weight loss can help PCOS and her lipids.

## 2015-08-19 NOTE — Telephone Encounter (Signed)
Routing to [place] for review and advise as Melvia Heaps CNM is out of the office today. No open telephone note. Labs from 08/07/2015 have returned.

## 2015-08-19 NOTE — Telephone Encounter (Signed)
Patient states she is returning a call to DL. She ask that a detailed message be left if she is unable to speak with DL today.

## 2015-08-19 NOTE — Telephone Encounter (Signed)
Routing to [place] for review.  Cc: Melvia Heaps CNM

## 2015-08-20 ENCOUNTER — Telehealth: Payer: Self-pay

## 2015-08-20 ENCOUNTER — Other Ambulatory Visit: Payer: Self-pay | Admitting: Certified Nurse Midwife

## 2015-08-20 NOTE — Telephone Encounter (Signed)
Patient returning your call, okay to leave detailed message. Best # to reach: [phone]

## 2015-08-20 NOTE — Telephone Encounter (Signed)
Left detailed message at number provided [phone], okay per ROI. Advised Crystal Alexander CNM has reviewed results and agrees with Dr.Jertson's recommendations regarding additional lab work. Will need to keep appointment as scheduled (please see telephone encounter dated 08/19/2015). Advised of her normal pap and abnormal cholesterol levels. Advised she will need to begin Omega 3 fish oil 1000 IU daily and recheck cholesterol in 6 months. Requested a return call to the office to schedule 6 month lab recheck.

## 2015-08-20 NOTE — Telephone Encounter (Signed)
Left message to call Kaitlyn at [phone]. 

## 2015-08-20 NOTE — Telephone Encounter (Signed)
-----   Message from Regina Eck, CNM sent at 08/20/2015 10:47 AM EDT ----- Notify patient that her pap smear is negative, HPVHR not detected 02 Testosterone is elevated which can be the reason for increase hair growth, I would like to you to have a progesterone and Hgb A1-c test also, due to the period irregularity this is probably a hormone imbalance  which can occur with PCOS . The hair growth can be controlled depending on lab results. Order in please schedule.Marland Kitchen DHEA was normal Prolactin and TSH is normal FSH is normal Lipid panel showed elevated cholesterol at 235, previous 202, LDL harmful cholesterol is 153 previous 127 Work on weight loss, exercise and decrease cholesterol foods. Start on Omega 3 fish oil 1000 IU daily would recheck in 6  Months. Order in please schedule

## 2015-08-21 ENCOUNTER — Other Ambulatory Visit (INDEPENDENT_AMBULATORY_CARE_PROVIDER_SITE_OTHER): Payer: 59

## 2015-08-21 DIAGNOSIS — L689 Hypertrichosis, unspecified: Secondary | ICD-10-CM

## 2015-08-22 LAB — HEMOGLOBIN A1C
Hgb A1c MFr Bld: 5.4 % (ref <5.7)
MEAN PLASMA GLUCOSE: 108 mg/dL

## 2015-08-22 LAB — CORTISOL: CORTISOL PLASMA: 15.6 ug/dL

## 2015-08-25 LAB — 17-HYDROXYPROGESTERONE: 17-OH-PROGESTERONE, LC/MS/MS: 90 ng/dL

## 2015-08-26 LAB — INSULIN-LIKE GROWTH FACTOR
IGF-I, LC/MS: 79 ng/mL (ref 53–331)
Z-SCORE (FEMALE): -1.2 {STDV} (ref ?–2.0)

## 2015-08-27 NOTE — Telephone Encounter (Signed)
Melvia Heaps CNM. Okay to close encounter at this time?

## 2015-08-27 NOTE — Telephone Encounter (Signed)
Spoke with patient. Advised of message and lab results as seen below from Crystal Alexander. She is agreeable and verbalizes understanding. Patient does not want to schedule consult appointment with Dr.Miller at this time or 6 month lab recheck. States she will need to check her work calendar and return call to schedule both.  Routing to provider for final review. Patient agreeable to disposition. Will close encounter.

## 2015-08-27 NOTE — Telephone Encounter (Signed)
-----   Message from Regina Eck, CNM sent at 08/27/2015  1:19 PM EDT ----- Notify patient that her IGF is normal, Cortisol is normal Hgb A1-C is normal  17 Hydroxyprogesterone is normal Would like her to schedule consult with Dr. Sabra Heck regarding medication management of excessive hair growth change. Please schedule

## 2016-04-16 ENCOUNTER — Encounter: Payer: Self-pay | Admitting: Certified Nurse Midwife

## 2016-04-16 ENCOUNTER — Telehealth: Payer: Self-pay | Admitting: Certified Nurse Midwife

## 2016-04-16 NOTE — Telephone Encounter (Signed)
I agree with plan. She was to return for lab work last year and did not. I reviewed my note and do not see where we discussed she could call for Rx.

## 2016-04-16 NOTE — Telephone Encounter (Signed)
Patient wants to start birth control. She would like to see if this is something DL would call into a pharmacy for her.

## 2016-04-16 NOTE — Telephone Encounter (Signed)
Spoke with patient. Patient asking if prescription for birth control can be called into pharmacy? Advised patient last AEX 08/07/15, would need an OV with Melvia Heaps, CNM to evaluate and discuss options, patient declined. Patient states she would just wait until next AEX as insurance will not cover a visit. Patient states she is anxious to start OCP and thought since discussed in the past could be called in. Advised patient will review with Melvia Heaps, CNM and return call if any additional recommendations. Patient verbalizes understanding and is agreeable.  Routing to provider for final review. Patient is agreeable to disposition. Will close encounter.

## 2016-04-23 ENCOUNTER — Encounter: Payer: Self-pay | Admitting: Certified Nurse Midwife

## 2016-08-11 ENCOUNTER — Ambulatory Visit: Payer: 59 | Admitting: Certified Nurse Midwife

## 2018-10-24 ENCOUNTER — Encounter: Payer: Self-pay | Admitting: Certified Nurse Midwife

## 2018-12-22 ENCOUNTER — Other Ambulatory Visit: Payer: Self-pay

## 2018-12-23 NOTE — Progress Notes (Signed)
38 y.o. G0P0000 Married  Caucasian Fe here to re-establish gyn care and  for annual exam. Periods normal with Maca Root and Ovasitol daily use.Duration 8-9 days and moderate to light, mild cramping. So much better. Has decided not to go further with pregnancy with her wife. Went to initial consult. Aware she has gained weight. Stopped Metformin use and does not want to restart. Has weight gain of 18 pounds and plans to work on this. Sees Dr Radene Ou with [facility], manages Asthma. No other health issues today.  Patient's last menstrual period was 12/16/2018 (exact date).          Sexually active: Yes.    The current method of family planning is none. Female partner   Exercising: Yes.    bike Smoker:  no  Review of Systems  Constitutional:       Irregular cycles  HENT: Negative.   Eyes: Negative.   Respiratory: Negative.   Cardiovascular: Negative.   Gastrointestinal: Negative.   Genitourinary: Negative.   Musculoskeletal: Negative.   Skin: Negative.   Neurological: Negative.   Endo/Heme/Allergies: Negative.   Psychiatric/Behavioral: Negative.     Health Maintenance: Pap:  10/2017 neg per patient History of Abnormal Pap: LEEP MMG:  none Self Breast exams: occ Colonoscopy:  none BMD:   none TDaP:  2019 Shingles: no Pneumonia: no Hep C and HIV: hep c neg 2016 Labs: if needed.   reports that she has quit smoking. Her smoking use included cigarettes. She has never used smokeless tobacco. She reports current alcohol use of about 1.0 - 2.0 standard drinks of alcohol per week. She reports that she does not use drugs.  Past Medical History:  Diagnosis Date  . Abnormal Pap smear of cervix    3/99 cin2 &3  . Asthma   . Fibroadenoma of breast     breast biopsy x 2  . HPV (human papilloma virus) infection   . Meniere's disease     Past Surgical History:  Procedure Laterality Date  . CERVICAL BIOPSY  W/ LOOP ELECTRODE EXCISION  1999   negative margins  . LEEP       Current Outpatient Medications  Medication Sig Dispense Refill  . fluticasone (FLOVENT HFA) 110 MCG/ACT inhaler Inhale 1 puff into the lungs daily.    Blanchie Dessert Chiro-Inositol (OVASITOL PO) Take by mouth 2 (two) times daily.    . Maca Root POWD by Does not apply route daily.    . TURMERIC PO Take by mouth daily.     No current facility-administered medications for this visit.     Family History  Problem Relation Age of Onset  . Hypertension Mother   . Parkinson's disease Mother   . Diabetes Father   . Hypertension Paternal Grandmother     ROS:  Pertinent items are noted in HPI.  Otherwise, a comprehensive ROS was negative.  Exam:   BP 110/70   Pulse 68   Temp (!) 97.2 F (36.2 C) (Skin)   Resp 16   Ht 5' 6.25" (1.683 m)   Wt 221 lb (100.2 kg)   LMP 12/16/2018 (Exact Date)   BMI 35.40 kg/m  Height: 5' 6.25" (168.3 cm) Ht Readings from Last 3 Encounters:  12/26/18 5' 6.25" (1.683 m)  08/07/15 5' 6.5" (1.689 m)  07/31/14 5' 6.5" (1.689 m)    General appearance: alert, cooperative and appears stated age Head: Normocephalic, without obvious abnormality, atraumatic Neck: no adenopathy, supple, symmetrical, trachea midline and thyroid normal  to inspection and palpation Lungs: clear to auscultation bilaterally Breasts: normal appearance, no masses or tenderness, No nipple retraction or dimpling, No nipple discharge or bleeding, No axillary or supraclavicular adenopathy Heart: regular rate and rhythm Abdomen: soft, non-tender; no masses,  no organomegaly Extremities: extremities normal, atraumatic, no cyanosis or edema Skin: Skin color, texture, turgor normal. No rashes or lesions, numerous tattoos Lymph nodes: Cervical, supraclavicular, and axillary nodes normal. No abnormal inguinal nodes palpated Neurologic: Grossly normal   Pelvic: External genitalia:  no lesions, normal appearance              Urethra:  normal appearing urethra with no masses, tenderness or  lesions              Bartholin's and Skene's: normal                 Vagina: normal appearing vagina with normal color and discharge, no lesions              Cervix: no cervical motion tenderness, no lesions and normal appearance              Pap taken: Yes.   Bimanual Exam:  Uterus:  normal size, contour, position, consistency, mobility, non-tender and anteverted              Adnexa: normal adnexa and no mass, fullness, tenderness               Rectovaginal: Confirms               Anus:  normal sphincter tone, no lesions  Chaperone present: yes  A:  Well Woman with normal exam  Contraception none needed, (wife)  History of PCOS, declines metformin use  History of irregular cycles, but uses Maca and Ovasitol with normal cycles now  Weight gain   Screening labs  P:   Reviewed health and wellness pertinent to exam  Aware of concerns with irregular cycles and PCOS profile and glucose concerns, will check labs today.  Patient will call if cycles become irregular.  Plans to work on [employer] management and exercise.  Labs: Lipid panel, TSH, Hgb A1-C, CMP, CBC  Pap smear: yes   counseled on breast self exam, feminine hygiene, adequate intake of calcium and vitamin D, diet and exercise  return annually or prn  An After Visit Summary was printed and given to the patient.

## 2018-12-26 ENCOUNTER — Encounter: Payer: Self-pay | Admitting: Certified Nurse Midwife

## 2018-12-26 ENCOUNTER — Ambulatory Visit (INDEPENDENT_AMBULATORY_CARE_PROVIDER_SITE_OTHER): Payer: 59 | Admitting: Certified Nurse Midwife

## 2018-12-26 ENCOUNTER — Other Ambulatory Visit (HOSPITAL_COMMUNITY)
Admission: RE | Admit: 2018-12-26 | Discharge: 2018-12-26 | Disposition: A | Discharge status: Home / Self Care | Payer: 59 | Source: Ambulatory Visit | Attending: Certified Nurse Midwife | Admitting: Certified Nurse Midwife

## 2018-12-26 ENCOUNTER — Other Ambulatory Visit: Payer: Self-pay

## 2018-12-26 VITALS — BP 110/70 | HR 68 | Temp 97.2°F | Resp 16 | Ht 66.25 in | Wt 221.0 lb

## 2018-12-26 DIAGNOSIS — Z124 Encounter for screening for malignant neoplasm of cervix: Secondary | ICD-10-CM | POA: Insufficient documentation

## 2018-12-26 DIAGNOSIS — Z0000 Encounter for general adult medical examination without abnormal findings: Secondary | ICD-10-CM | POA: Diagnosis not present

## 2018-12-26 DIAGNOSIS — E559 Vitamin D deficiency, unspecified: Secondary | ICD-10-CM

## 2018-12-26 DIAGNOSIS — Z01419 Encounter for gynecological examination (general) (routine) without abnormal findings: Secondary | ICD-10-CM | POA: Diagnosis not present

## 2018-12-26 DIAGNOSIS — R635 Abnormal weight gain: Secondary | ICD-10-CM

## 2018-12-26 DIAGNOSIS — Z8742 Personal history of other diseases of the female genital tract: Secondary | ICD-10-CM

## 2018-12-26 NOTE — Patient Instructions (Signed)

## 2018-12-27 LAB — CBC
Hematocrit: 43 % (ref 34.0–46.6)
Hemoglobin: 14.6 g/dL (ref 11.1–15.9)
MCH: 29.5 pg (ref 26.6–33.0)
MCHC: 34 g/dL (ref 31.5–35.7)
MCV: 87 fL (ref 79–97)
Platelets: 366 10*3/uL (ref 150–450)
RBC: 4.95 x10E6/uL (ref 3.77–5.28)
RDW: 14.2 % (ref 11.7–15.4)
WBC: 8.8 10*3/uL (ref 3.4–10.8)

## 2018-12-27 LAB — COMPREHENSIVE METABOLIC PANEL
ALT: 19 IU/L (ref 0–32)
AST: 19 IU/L (ref 0–40)
Albumin/Globulin Ratio: 1.8 (ref 1.2–2.2)
Albumin: 4.5 g/dL (ref 3.8–4.8)
Alkaline Phosphatase: 88 IU/L (ref 39–117)
BUN/Creatinine Ratio: 13 (ref 9–23)
BUN: 10 mg/dL (ref 6–20)
Bilirubin Total: 0.3 mg/dL (ref 0.0–1.2)
CO2: 19 mmol/L — ABNORMAL LOW (ref 20–29)
Calcium: 9.5 mg/dL (ref 8.7–10.2)
Chloride: 103 mmol/L (ref 96–106)
Creatinine, Ser: 0.77 mg/dL (ref 0.57–1.00)
GFR calc Af Amer: 113 mL/min/{1.73_m2} (ref >59)
GFR calc non Af Amer: 98 mL/min/{1.73_m2} (ref >59)
Globulin, Total: 2.5 g/dL (ref 1.5–4.5)
Glucose: 102 mg/dL — ABNORMAL HIGH (ref 65–99)
Potassium: 4 mmol/L (ref 3.5–5.2)
Sodium: 139 mmol/L (ref 134–144)
Total Protein: 7 g/dL (ref 6.0–8.5)

## 2018-12-27 LAB — LIPID PANEL
Chol/HDL Ratio: 5.2 ratio — ABNORMAL HIGH (ref 0.0–4.4)
Cholesterol, Total: 225 mg/dL — ABNORMAL HIGH (ref 100–199)
HDL: 43 mg/dL (ref >39)
LDL Chol Calc (NIH): 142 mg/dL — ABNORMAL HIGH (ref 0–99)
Triglycerides: 223 mg/dL — ABNORMAL HIGH (ref 0–149)
VLDL Cholesterol Cal: 40 mg/dL (ref 5–40)

## 2018-12-27 LAB — HEMOGLOBIN A1C
Est. average glucose Bld gHb Est-mCnc: 105 mg/dL
Hgb A1c MFr Bld: 5.3 % (ref 4.8–5.6)

## 2018-12-27 LAB — VITAMIN D 25 HYDROXY (VIT D DEFICIENCY, FRACTURES): Vit D, 25-Hydroxy: 32.7 ng/mL (ref 30.0–100.0)

## 2018-12-27 LAB — TSH: TSH: 3.24 u[IU]/mL (ref 0.450–4.500)

## 2018-12-28 ENCOUNTER — Other Ambulatory Visit: Payer: Self-pay | Admitting: Certified Nurse Midwife

## 2018-12-28 ENCOUNTER — Telehealth: Payer: Self-pay

## 2018-12-28 DIAGNOSIS — R899 Unspecified abnormal finding in specimens from other organs, systems and tissues: Secondary | ICD-10-CM

## 2018-12-28 NOTE — Telephone Encounter (Signed)
-----   Message from Regina Eck, CNM sent at 12/28/2018  8:12 AM EDT ----- Notify patient her HGB A1-C is normal at 5.3 Liver, kidney profile essentially normal, Glucose borderline elevation at 102 but HGB A1-C was normal Lipid panel is elevated with Cholesterol at 225, triglycerides at 223, HDL at 43, LDL at 142. She had gained weight which sometimes changes the levels, but with risk ratio a 5.2 normal is up to 4.4 feel she needs to take this seriously and work on weight loss, change in diet with increase in fresh vegetables, lean protein, fish not fried, limited carbohydrates and soda or sweetened beverages. Regular exercise 4-5 times weekly. We can refer her dietary counseling if needed. Recheck Lipid panel in 3 months fasting. Start on Omega 3 fish oil daily order placed TSH is normal Vitamin D is normal at 32.7 CBC is normal Pap pending

## 2018-12-28 NOTE — Telephone Encounter (Signed)
Left message for call back.

## 2018-12-29 LAB — CYTOLOGY - PAP
Diagnosis: NEGATIVE
High risk HPV: NEGATIVE
Molecular Disclaimer: 56
Molecular Disclaimer: DETECTED
Molecular Disclaimer: NORMAL

## 2018-12-29 NOTE — Telephone Encounter (Signed)
Prefer she work with diet at this point. Red yeast rice does help, but it also can be harmful

## 2018-12-29 NOTE — Telephone Encounter (Signed)
Spoke with patient, advised per Melvia Heaps, CNM. Lab appt scheduled for 12/18 at 8:45am.   Patient request Melvia Heaps, CNM opinion on "Red Rice Yeast" supplement for lowering cholesterol?   Routing to Melvia Heaps, CNM  to review and advise.

## 2018-12-29 NOTE — Telephone Encounter (Signed)
Patient is returning a call to [place]. Crystal Alexander is out of the office today. She ask if someone else could please call her with her results?

## 2018-12-29 NOTE — Telephone Encounter (Signed)
Spoke with patient, advised as seen below per Deborah Leonard, CNM. Patient verbalizes understanding and is agreeable.   Encounter closed.  

## 2019-03-16 ENCOUNTER — Telehealth: Payer: Self-pay | Admitting: Certified Nurse Midwife

## 2019-03-16 NOTE — Telephone Encounter (Signed)
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 This prescribed by dermatology. I would encourage her to seek Dermatology evaluation. Dr. Delman Cheadle is a good option.

## 2019-03-16 NOTE — Telephone Encounter (Signed)
Henrene Alexander, Estella  [facility]  Phone Number: [phone]  Hi there! A friend of mine was telling me about Vaniqa for her hirsutism and that it has worked really well. I was wondering if this is something you could prescribe me for my issue due to my PCOS? I am due to come in for a cholesterol recheck on the 18th and wondered if I may pick it up then? Thanks so much in advance!   Crystal Alexander

## 2019-03-16 NOTE — Telephone Encounter (Signed)
Spoke with patient, advised per Melvia Heaps, CNM. Patient states she has seen Dr. Delman Cheadle in the past, does not want to see any additional providers during "pandemic", prefers to wait. Patient verbalizes understanding.   Routing to provider for final review. Patient is agreeable to disposition. Will close encounter.

## 2019-03-16 NOTE — Telephone Encounter (Signed)
Crystal Alexander, CNM - please review patient MyChart message and advise if OV needed to further discuss?

## 2019-03-24 ENCOUNTER — Other Ambulatory Visit: Payer: Self-pay

## 2019-06-13 NOTE — Telephone Encounter (Signed)
Spoke to pt. Pt plans to scheduled Lipid panel at PCP then to follow up with PCP for treatment.   Routing to D.Hollice Espy, CNM for review and will close encounter.

## 2019-06-23 ENCOUNTER — Encounter: Payer: Self-pay | Admitting: Certified Nurse Midwife

## 2019-12-27 ENCOUNTER — Ambulatory Visit: Payer: 59 | Admitting: Certified Nurse Midwife

## 2019-12-27 ENCOUNTER — Ambulatory Visit: Payer: 59 | Admitting: Obstetrics and Gynecology

## 2020-01-31 ENCOUNTER — Encounter (HOSPITAL_COMMUNITY): Payer: Self-pay | Admitting: *Deleted

## 2020-01-31 ENCOUNTER — Other Ambulatory Visit: Payer: Self-pay

## 2020-01-31 ENCOUNTER — Emergency Department (HOSPITAL_COMMUNITY)
Admission: EM | Admit: 2020-01-31 | Discharge: 2020-01-31 | Disposition: A | Discharge status: Home / Self Care | Payer: BC Managed Care – PPO | Attending: Emergency Medicine | Admitting: Emergency Medicine

## 2020-01-31 DIAGNOSIS — R002 Palpitations: Secondary | ICD-10-CM | POA: Insufficient documentation

## 2020-01-31 DIAGNOSIS — J45909 Unspecified asthma, uncomplicated: Secondary | ICD-10-CM | POA: Diagnosis not present

## 2020-01-31 DIAGNOSIS — Z7951 Long term (current) use of inhaled steroids: Secondary | ICD-10-CM | POA: Diagnosis not present

## 2020-01-31 DIAGNOSIS — Z87891 Personal history of nicotine dependence: Secondary | ICD-10-CM | POA: Insufficient documentation

## 2020-01-31 DIAGNOSIS — R000 Tachycardia, unspecified: Secondary | ICD-10-CM | POA: Diagnosis present

## 2020-01-31 DIAGNOSIS — D72829 Elevated white blood cell count, unspecified: Secondary | ICD-10-CM | POA: Diagnosis not present

## 2020-01-31 LAB — TROPONIN I (HIGH SENSITIVITY): Troponin I (High Sensitivity): <2 ng/L (ref <18)

## 2020-01-31 LAB — CBC WITH DIFFERENTIAL/PLATELET
Abs Immature Granulocytes: 0.09 10*3/uL — ABNORMAL HIGH (ref 0.00–0.07)
Basophils Absolute: 0.1 10*3/uL (ref 0.0–0.1)
Basophils Relative: 0 %
Eosinophils Absolute: 0.1 10*3/uL (ref 0.0–0.5)
Eosinophils Relative: 1 %
HCT: 43.1 % (ref 36.0–46.0)
Hemoglobin: 14.3 g/dL (ref 12.0–15.0)
Immature Granulocytes: 1 %
Lymphocytes Relative: 14 %
Lymphs Abs: 2.2 10*3/uL (ref 0.7–4.0)
MCH: 29.2 pg (ref 26.0–34.0)
MCHC: 33.2 g/dL (ref 30.0–36.0)
MCV: 88 fL (ref 80.0–100.0)
Monocytes Absolute: 1.5 10*3/uL — ABNORMAL HIGH (ref 0.1–1.0)
Monocytes Relative: 9 %
Neutro Abs: 12.3 10*3/uL — ABNORMAL HIGH (ref 1.7–7.7)
Neutrophils Relative %: 75 %
Platelets: 394 10*3/uL (ref 150–400)
RBC: 4.9 MIL/uL (ref 3.87–5.11)
RDW: 13.7 % (ref 11.5–15.5)
WBC: 16.2 10*3/uL — ABNORMAL HIGH (ref 4.0–10.5)
nRBC: 0 % (ref 0.0–0.2)

## 2020-01-31 LAB — BASIC METABOLIC PANEL
Anion gap: 11 (ref 5–15)
BUN: 13 mg/dL (ref 6–20)
CO2: 23 mmol/L (ref 22–32)
Calcium: 9.3 mg/dL (ref 8.9–10.3)
Chloride: 104 mmol/L (ref 98–111)
Creatinine, Ser: 0.8 mg/dL (ref 0.44–1.00)
GFR, Estimated: >60 mL/min (ref >60)
Glucose, Bld: 131 mg/dL — ABNORMAL HIGH (ref 70–99)
Potassium: 3.5 mmol/L (ref 3.5–5.1)
Sodium: 138 mmol/L (ref 135–145)

## 2020-01-31 MED ORDER — ACETAMINOPHEN 325 MG PO TABS: 650.0000 mg | ORAL_TABLET | Freq: Once | ORAL | Status: DC

## 2020-01-31 NOTE — ED Triage Notes (Signed)
Pt was in the car with significant other when she felt a "wave of heat" throughout her body and had a metallic taste in her mouth. She was fully vaccinated for Covid in April and received a booster at 1800 today. Pt states that she has felt a "fluttering" in her chest at times in the past two days, but she dismissed it as gas.

## 2020-01-31 NOTE — Discharge Instructions (Signed)
Recommend follow-up with your primary care doctor regarding your elevated white blood cell count.  If you develop chest pain, difficulty breathing or other new concerning symptom, return to ER for reassessment.

## 2020-01-31 NOTE — ED Provider Notes (Signed)
[facility] DEPT Provider Note   CSN: [id number] Arrival date & time: 01/31/20  2002     History Chief Complaint  Patient presents with  . Tachycardia    Crystal Alexander is a 39 y.o. female.  Presents to ER with concern for flush sensation.  Patient reports around 6:20 PM she received her [employer] booster.  Proximately 30 minutes later she felt a wave of heat over her body, chest.  Did not have any chest pain, no difficulty in breathing.  States that sensation slowly resolved.  At the time she noticed her heart rate was very elevated, up to 150 bpm according to her watch.  States that her symptoms have resolved.  She reports history of asthma, denies any other medical problems.  Former smoker, quit 7 or 8 years ago.  No personal history of DVT/PE.  No family history of CAD.  HPI     Past Medical History:  Diagnosis Date  . Abnormal Pap smear of cervix    3/99 cin2 &3  . Asthma   . Fibroadenoma of breast     breast biopsy x 2  . HPV (human papilloma virus) infection   . Meniere's disease     Patient Active Problem List   Diagnosis Date Noted  . Asthma 04/06/2001    Past Surgical History:  Procedure Laterality Date  . CERVICAL BIOPSY  W/ LOOP ELECTRODE EXCISION  1999   negative margins  . LEEP       OB History    Gravida  0   Para  0   Term  0   Preterm  0   AB  0   Living  0     SAB  0   TAB  0   Ectopic  0   Multiple  0   Live Births              Family History  Problem Relation Age of Onset  . Hypertension Mother   . Parkinson's disease Mother   . Diabetes Father   . Hypertension Paternal Grandmother     Social History   Tobacco Use  . Smoking status: Former Smoker    Types: Cigarettes  . Smokeless tobacco: Never Used  Substance Use Topics  . Alcohol use: Yes    Alcohol/week: 20.0 standard drinks    Types: 20 Cans of beer per week    Comment: in a month  . Drug use: No    Home  Medications Prior to Admission medications   Medication Sig Start Date End Date Taking? Authorizing Provider  fluticasone (FLOVENT HFA) 110 MCG/ACT inhaler Inhale 1 puff into the lungs daily.   Yes [provider]  metFORMIN (GLUCOPHAGE) 1000 MG tablet Take 1,000 mg by mouth 2 (two) times daily. 12/03/19  Yes [provider]    Allergies    Patient has no known allergies.  Review of Systems   Review of Systems  Constitutional: Negative for chills and fever.  HENT: Negative for ear pain and sore throat.   Eyes: Negative for pain and visual disturbance.  Respiratory: Negative for cough and shortness of breath.   Cardiovascular: Negative for chest pain and palpitations.  Gastrointestinal: Negative for abdominal pain and vomiting.  Genitourinary: Negative for dysuria and hematuria.  Musculoskeletal: Negative for arthralgias and back pain.  Skin: Negative for color change and rash.  Neurological: Negative for seizures and syncope.  All other systems reviewed and are negative.   Physical  Exam Updated Vital Signs BP (!) 146/86   Pulse (!) 120   Temp 97.6 F (36.4 C) (Oral)   Resp 14   Ht 5\' 6"  (1.676 m)   Wt 99.8 kg   SpO2 100%   BMI 35.51 kg/m   Physical Exam Vitals and nursing note reviewed.  Constitutional:      General: She is not in acute distress.    Appearance: She is well-developed.  HENT:     Head: Normocephalic and atraumatic.  Eyes:     Conjunctiva/sclera: Conjunctivae normal.  Cardiovascular:     Rate and Rhythm: Regular rhythm. Tachycardia present.     Heart sounds: No murmur heard.   Pulmonary:     Effort: Pulmonary effort is normal. No respiratory distress.     Breath sounds: Normal breath sounds.  Abdominal:     Palpations: Abdomen is soft.     Tenderness: There is no abdominal tenderness.  Musculoskeletal:        General: No deformity or signs of injury.     Cervical back: Neck supple.  Skin:    General: Skin is warm and dry.      Capillary Refill: Capillary refill takes less than 2 seconds.  Neurological:     General: No focal deficit present.     Mental Status: She is alert and oriented to person, place, and time.  Psychiatric:        Mood and Affect: Mood normal.        Behavior: Behavior normal.     ED Results / Procedures / Treatments   Labs (all labs ordered are listed, but only abnormal results are displayed) Labs Reviewed  CBC WITH DIFFERENTIAL/PLATELET - Abnormal; Notable for the following components:      Result Value   WBC 16.2 (*)    Neutro Abs 12.3 (*)    Monocytes Absolute 1.5 (*)    Abs Immature Granulocytes 0.09 (*)    All other components within normal limits  BASIC METABOLIC PANEL - Abnormal; Notable for the following components:   Glucose, Bld 131 (*)    All other components within normal limits  TROPONIN I (HIGH SENSITIVITY)    EKG EKG Interpretation  Date/Time:  Wednesday January 31 2020 21:15:04 EDT Ventricular Rate:  102 PR Interval:    QRS Duration: 92 QT Interval:  343 QTC Calculation: 447 R Axis:   55 Text Interpretation: Sinus tachycardia Borderline T abnormalities, anterior leads Confirmed by Madalyn Rob [phone]) on 01/31/2020 9:29:45 PM   Radiology No results found.  Procedures Procedures (including critical care time)  Medications Ordered in ED Medications  acetaminophen (TYLENOL) tablet 650 mg (has no administration in time range)    ED Course  I have reviewed the triage vital signs and the nursing notes.  Pertinent labs & imaging results that were available during my care of the patient were reviewed by me and considered in my medical decision making (see chart for details).    MDM Rules/Calculators/A&P                          39 year old lady presenting to the emergency room after she had a flushed sensation and episode of fast heart rate after receiving Covid vaccine this evening.  When I assessed patient, she had no ongoing symptoms.  No chest  pain or shortness of breath throughout the episodes.  EKG showing NSR without acute ischemic change, troponin within normal limits, doubt ACS.  Noted mild  leukocytosis, which may be related to recent vaccine dose.  Discussed this with patient and recommended this be rechecked in the next week or 2 by primary care.  Given no ongoing symptoms, reassuring vitals, believe she can be discharged home.   After the discussed management above, the patient was determined to be safe for discharge.  The patient was in agreement with this plan and all questions regarding their care were answered.  ED return precautions were discussed and the patient will return to the ED with any significant worsening of condition.  Final Clinical Impression(s) / ED Diagnoses Final diagnoses:  Palpitations  Leukocytosis, unspecified type    Rx / DC Orders ED Discharge Orders    None       Lucrezia Starch, MD 01/31/20 [id number]

## 2020-07-11 ENCOUNTER — Telehealth (HOSPITAL_COMMUNITY): Payer: BC Managed Care – PPO | Admitting: Psychiatry

## 2020-07-12 ENCOUNTER — Encounter (HOSPITAL_COMMUNITY): Payer: Self-pay | Admitting: Psychiatry

## 2020-07-12 ENCOUNTER — Telehealth (INDEPENDENT_AMBULATORY_CARE_PROVIDER_SITE_OTHER): Payer: BC Managed Care – PPO | Admitting: Psychiatry

## 2020-07-12 DIAGNOSIS — F4001 Agoraphobia with panic disorder: Secondary | ICD-10-CM | POA: Diagnosis not present

## 2020-07-12 DIAGNOSIS — F411 Generalized anxiety disorder: Secondary | ICD-10-CM | POA: Diagnosis not present

## 2020-07-12 MED ORDER — DULOXETINE HCL 20 MG PO CPEP: 20.0000 mg | ORAL_CAPSULE | Freq: Every day | ORAL | 0 refills | Status: DC

## 2020-07-12 MED ORDER — ALPRAZOLAM 0.25 MG PO TABS: 0.2500 mg | ORAL_TABLET | Freq: Every day | ORAL | 0 refills | Status: DC | PRN

## 2020-07-12 NOTE — Progress Notes (Signed)
Psychiatric Initial Adult Assessment   Patient Identification: Crystal Alexander MRN:  [id number] Date of Evaluation:  07/12/2020 Referral Source: primary care Chief Complaint:  establish care, anxiety and panic attacks Visit Diagnosis:    ICD-10-CM   1. 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 GAD (generalized anxiety disorder)  F41.1    Virtual Visit via Video Note  I connected with [name] on 02/54/27 at 11:00 AM EDT by a video enabled telemedicine application and verified that I am speaking with the correct person using two identifiers.  Location: Patient: home Provider: home office   I discussed the limitations of evaluation and management by telemedicine and the availability of in person appointments. The patient expressed understanding and agreed to proceed.     I discussed the assessment and treatment plan with the patient. The patient was provided an opportunity to ask questions and all were answered. The patient agreed with the plan and demonstrated an understanding of the instructions.   The patient was advised to call back or seek an in-person evaluation if the symptoms worsen or if the condition fails to improve as anticipated.  I provided 45 minutes of non-face-to-face time during this encounter including compiling and documentation.   History of Present Illness: Patient is a 40 years old married Caucasian female she works as a Geophysicist/field seismologist.  Her parents live in the same street she takes care of her mom who has parkinsonism.  Referred by primary care physician for establish care for anxiety and panic attacks  Patient endorses long history of having anxiety and panic attacks including generalized anxiety excessive worries at times unreasonable worries.  She has had her first panic attack at age 38 she was feeling gaggy and nervous.  She started taking treatment around her age 51s by her primary care physician for anxiety she has had suffered from social  anxiety.  She has had a fibroid surgery and after that she had panic attacks and has been on Paxil and Zoloft in the past but she did not like it it made her feel strange.  She has taken Xanax small dose of 0.25 mg in the past and that has helped  There was a time that she did not have panic attack for a while but after pandemic she was not working that much considering now she is back but still has to wear a mask she still has panic attacks and has to excuse from her clients saying that she has to run for coffee.  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  She worries about having a panic attack recently in the last couple of months they are happening more frequently she has apprehension of panic attack and also feels stuck if she is in traffic or driving a car.  She also has fears of close spaces including [employer] or alleviators at times.  She has been doing exercise and keeping self busy in the past but recently during the Covid it has slowed down.  She takes care of mom who has parkinsonism and cerebral palsy.  Patient over generalizes things and also has asthma sometimes she feels her anxiety worsens her asthma and then she starts worrying about what if my panic attack would make my asthma worsen this leads to another cycle of anxiety and panic.  Her wife is supportive.  Patient does not endorse psychotic symptoms delusions hallucinations or paranoia  Patient describes herself otherwise to be happy person and not having hopelessness or depressive symptoms  She is being evaluated for some inflammatory process when she got her blood work done as of exposure of possible autoimmune disease that is also making her anxious   Aggravating factors; pandemic, mom has parkinsonism.  Medical comorbidity Modifying factors her wife 8 years of marriage exercise regimen  Patient has used CBD oil at times for sleep denies using alcohol or any other street drug on a regular basis not using alcohol  either    Past Psychiatric History: anxiety, panic attacks  Previous Psychotropic Medications: Yes   Substance Abuse History in the last 12 months:  No.  Consequences of Substance Abuse: NA  Past Medical History:  Past Medical History:  Diagnosis Date  . Abnormal Pap smear of cervix    3/99 cin2 &3  . Asthma   . Fibroadenoma of breast     breast biopsy x 2  . HPV (human papilloma virus) infection   . Meniere's disease     Past Surgical History:  Procedure Laterality Date  . CERVICAL BIOPSY  W/ LOOP ELECTRODE EXCISION  1999   negative margins  . LEEP      Family Psychiatric History: mom: anxiety  Family History:  Family History  Problem Relation Age of Onset  . Hypertension Mother   . Parkinson's disease Mother   . Diabetes Father   . Hypertension Paternal Grandmother     Social History:   Social History   Socioeconomic History  . Marital status: Married    Spouse name: Not on file  . Number of children: Not on file  . Years of education: Not on file  . Highest education level: Not on file  Occupational History  . Not on file  Tobacco Use  . Smoking status: Former Smoker    Types: Cigarettes  . Smokeless tobacco: Never Used  Substance and Sexual Activity  . Alcohol use: Yes    Alcohol/week: 20.0 standard drinks    Types: 20 Cans of beer per week    Comment: in a month  . Drug use: No  . Sexual activity: Yes    Partners: Female    Birth control/protection: None    Comment: Female partner  Other Topics Concern  . Not on file  Social History Narrative  . Not on file   Social Determinants of Health   Financial Resource Strain: Not on file  Food Insecurity: Not on file  Transportation Needs: Not on file  Physical Activity: Not on file  Stress: Not on file  Social Connections: Not on file    Additional Social History: grew up with parents, sister, dad was somewhat harsh  Mom had anxiety. Did well in school, had anxiety when younger    Allergies:  No Known Allergies  Metabolic Disorder Labs: Lab Results  Component Value Date   HGBA1C 5.3 12/26/2018   MPG 108 08/21/2015   Lab Results  Component Value Date   PROLACTIN 13.1 08/07/2015   Lab Results  Component Value Date   CHOL 225 (H) 12/26/2018   TRIG 223 (H) 12/26/2018   HDL 43 12/26/2018   CHOLHDL 5.2 (H) 12/26/2018   VLDL 28 08/07/2015   LDLCALC 142 (H) 12/26/2018   LDLCALC 153 (H) 08/07/2015   Lab Results  Component Value Date   TSH 3.240 12/26/2018    Therapeutic Level Labs: No results found for: LITHIUM No results found for: CBMZ No results found for: VALPROATE  Current Medications: Current Outpatient Medications  Medication Sig Dispense Refill  . ALPRAZolam (XANAX) 0.25  MG tablet Take 1 tablet (0.25 mg total) by mouth daily as needed for anxiety. 15 tablet 0  . DULoxetine (CYMBALTA) 20 MG capsule Take 1 capsule (20 mg total) by mouth daily. 30 capsule 0  . fluticasone (FLOVENT HFA) 110 MCG/ACT inhaler Inhale 1 puff into the lungs daily.    . metFORMIN (GLUCOPHAGE) 1000 MG tablet Take 1,000 mg by mouth 2 (two) times daily.     No current facility-administered medications for this visit.      Psychiatric Specialty Exam: Review of Systems  Cardiovascular: Negative for chest pain.  Psychiatric/Behavioral: Negative for agitation and self-injury. The patient is nervous/anxious.     There were no vitals taken for this visit.There is no height or weight on file to calculate BMI.  General Appearance: Casual  Eye Contact:  Fair  Speech:  Clear and Coherent  Volume:  Normal  Mood:  Anxious  Affect:  Full Range  Thought Process:  Goal Directed  Orientation:  Full (Time, Place, and Person)  Thought Content:  Rumination  Suicidal Thoughts:  No  Homicidal Thoughts:  No  Memory:  Immediate;   Fair Recent;   Fair  Judgement:  Fair  Insight:  Fair  Psychomotor Activity:  Normal  Concentration:  Concentration: Fair and Attention Span: Fair   Recall:  Good  Fund of Knowledge:Good  Language: Good  Akathisia:  No  Handed:   AIMS (if indicated):  not done  Assets:  Communication Skills Desire for Improvement Financial Resources/Insurance Social Support  ADL's:  Intact  Cognition: WNL  Sleep:  variable   Screenings: PHQ2-9   Flowsheet Row Video Visit from 07/12/2020 in [facility]  PHQ-2 Total Score 0    Flowsheet Row Video Visit from 07/12/2020 in [place] No Risk      Assessment and Plan: as follows Panic disorder with agoraphobia; I do highly recommend psychotherapy talked about breathing techniques.  Distraction.  We will start Cymbalta considering she has some aches 20 mg small dose discussed and reviewed side effects added Xanax 0.25 mg small dose as needed for anxiety that is helped in the past discussed its risk and tolerance  Generalized anxiety disorder; treatment as above  Follow-up in 4 to 5 weeks or earlier if needed   Merian Capron, MD 4/8/202211:34 AM

## 2020-08-03 ENCOUNTER — Other Ambulatory Visit (HOSPITAL_COMMUNITY): Payer: Self-pay | Admitting: Psychiatry

## 2020-08-19 ENCOUNTER — Telehealth (INDEPENDENT_AMBULATORY_CARE_PROVIDER_SITE_OTHER): Payer: BC Managed Care – PPO | Admitting: Psychiatry

## 2020-08-19 ENCOUNTER — Encounter (HOSPITAL_COMMUNITY): Payer: Self-pay | Admitting: Psychiatry

## 2020-08-19 DIAGNOSIS — F411 Generalized anxiety disorder: Secondary | ICD-10-CM

## 2020-08-19 DIAGNOSIS — F4001 Agoraphobia with panic disorder: Secondary | ICD-10-CM | POA: Diagnosis not present

## 2020-08-19 NOTE — Progress Notes (Signed)
[facility] Follow up visit   Patient Identification: Crystal Alexander MRN:  [id number] Date of Evaluation:  08/19/2020 Referral Source: primary care Chief Complaint: follow up anxiety, panic attacks Visit Diagnosis:    ICD-10-CM   1. 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 GAD (generalized anxiety disorder)  F41.1    Virtual Visit via Video Note  I connected with Crystal Alexander on [birth year]/50 at  2:30 PM EDT by a video enabled telemedicine application and verified that I am speaking with the correct person using two identifiers.  Location: Patient: home Provider: home office   I discussed the limitations of evaluation and management by telemedicine and the availability of in person appointments. The patient expressed understanding and agreed to proceed.   I discussed the assessment and treatment plan with the patient. The patient was provided an opportunity to ask questions and all were answered. The patient agreed with the plan and demonstrated an understanding of the instructions.   The patient was advised to call back or seek an in-person evaluation if the symptoms worsen or if the condition fails to improve as anticipated.  I provided 12  minutes of non-face-to-face time during this encounter.     History of Present Illness: Patient is a 40 years old married Caucasian female she works as a Geophysicist/field seismologist.  Her parents live in the same street she takes care of her mom who has parkinsonism.  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  She is going through evaluation for autoimmune disease also she has polycystic disease and asthma that may also contribute to anxiety  She has been taking Xanax one fourth discussed to take half or full tablet 0.25 mg carry with her in case she needs for panic attacks  Her wife is supportive.  Aggravating factors; pandemic, mom has parkinsonism.  Medical comorbidity Modifying factors; her wife of 8 years     Past Psychiatric History: anxiety, panic attacks  Previous Psychotropic Medications: Yes   Substance Abuse History in the last 12 months:  No.  Consequences of Substance Abuse: NA  Past Medical History:  Past Medical History:  Diagnosis Date  . Abnormal Pap smear of cervix    3/99 cin2 &3  . Asthma   . Fibroadenoma of breast     breast biopsy x 2  . HPV (human papilloma virus) infection   . Meniere's disease     Past Surgical History:  Procedure Laterality Date  . CERVICAL BIOPSY  W/ LOOP ELECTRODE EXCISION  1999   negative margins  . LEEP      Family Psychiatric History: mom: anxiety  Family History:  Family History  Problem Relation Age of Onset  . Hypertension Mother   . Parkinson's disease Mother   . Diabetes Father   . Hypertension Paternal Grandmother     Social History:   Social History   Socioeconomic History  . Marital status: Married    Spouse name: Not on file  . Number of children: Not on file  . Years of education: Not on  file  . Highest education level: Not on file  Occupational History  . Not on file  Tobacco Use  . Smoking status: Former Smoker    Types: Cigarettes  . Smokeless tobacco: Never Used  Substance and Sexual Activity  . Alcohol use: Yes    Alcohol/week: 20.0 standard drinks    Types: 20 Cans of beer per week    Comment: in a month  . Drug use: No  . Sexual activity: Yes    Partners: Female    Birth control/protection: None    Comment: Female partner  Other Topics Concern  . Not on file  Social  History Narrative  . Not on file   Social Determinants of Health   Financial Resource Strain: Not on file  Food Insecurity: Not on file  Transportation Needs: Not on file  Physical Activity: Not on file  Stress: Not on file  Social Connections: Not on file     Allergies:  No Known Allergies  Metabolic Disorder Labs: Lab Results  Component Value Date   HGBA1C 5.3 12/26/2018   MPG 108 08/21/2015   Lab Results  Component Value Date   PROLACTIN 13.1 08/07/2015   Lab Results  Component Value Date   CHOL 225 (H) 12/26/2018   TRIG 223 (H) 12/26/2018   HDL 43 12/26/2018   CHOLHDL 5.2 (H) 12/26/2018   VLDL 28 08/07/2015   LDLCALC 142 (H) 12/26/2018   LDLCALC 153 (H) 08/07/2015   Lab Results  Component Value Date   TSH 3.240 12/26/2018    Therapeutic Level Labs: No results found for: LITHIUM No results found for: CBMZ No results found for: VALPROATE  Current Medications: Current Outpatient Medications  Medication Sig Dispense Refill  . ALPRAZolam (XANAX) 0.25 MG tablet Take 1 tablet (0.25 mg total) by mouth daily as needed for anxiety. 15 tablet 0  . DULoxetine (CYMBALTA) 20 MG capsule TAKE 1 CAPSULE BY MOUTH EVERY DAY 90 capsule 1  . fluticasone (FLOVENT HFA) 110 MCG/ACT inhaler Inhale 1 puff into the lungs daily.    . metFORMIN (GLUCOPHAGE) 1000 MG tablet Take 1,000 mg by mouth 2 (two) times daily.     No current facility-administered medications for this visit.      Psychiatric Specialty Exam: Review of Systems  Cardiovascular: Negative for chest pain.  Psychiatric/Behavioral: Negative for agitation and self-injury.    There were no vitals taken for this visit.There is no height or weight on file to calculate BMI.  General Appearance: Casual  Eye Contact:  Fair  Speech:  Clear and Coherent  Volume:  Normal  Mood: Some better  Affect:  Full Range  Thought Process:  Goal Directed  Orientation:  Full (Time, Place, and Person)  Thought Content:   Rumination  Suicidal Thoughts:  No  Homicidal Thoughts:  No  Memory:  Immediate;   Fair Recent;   Fair  Judgement:  Fair  Insight:  Fair  Psychomotor Activity:  Normal  Concentration:  Concentration: Fair and Attention Span: Fair  Recall:  Good  Fund of Knowledge:Good  Language: Good  Akathisia:  No  Handed:   AIMS (if indicated):  not done  Assets:  Communication Skills Desire for Improvement Financial Resources/Insurance Social Support  ADL's:  Intact  Cognition: WNL  Sleep:  variable   Screenings: PHQ2-9   Flowsheet Row Video Visit from 07/12/2020 in [place] [facility]  PHQ-2 Total Score 0    Flowsheet Row Video Visit from 08/19/2020 in [facility] Video Visit from 07/12/2020 in [facility] No Risk No Risk      Assessment and Plan: as follows Panic disorder with agoraphobia; some better still happens work on distraction she should take Xanax full tablet instead of one quarter.  Cymbalta is helping she does not want to change or adjust as of now discussed breathing techniques   Generalized anxiety disorder; doing somewhat better treatment as above  Follow-up in 2 months or earlier if needed   Merian Capron, MD 5/16/20222:43 PM

## 2020-08-21 ENCOUNTER — Ambulatory Visit (INDEPENDENT_AMBULATORY_CARE_PROVIDER_SITE_OTHER): Payer: BC Managed Care – PPO | Admitting: Licensed Clinical Social Worker

## 2020-08-21 DIAGNOSIS — F4001 Agoraphobia with panic disorder: Secondary | ICD-10-CM

## 2020-08-21 DIAGNOSIS — F411 Generalized anxiety disorder: Secondary | ICD-10-CM
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
## 2020-09-02 ENCOUNTER — Other Ambulatory Visit (HOSPITAL_COMMUNITY): Payer: Self-pay | Admitting: Psychiatry

## 2020-09-04 ENCOUNTER — Telehealth (HOSPITAL_COMMUNITY): Payer: Self-pay | Admitting: Psychiatry

## 2020-09-04 MED ORDER — BUSPIRONE HCL 7.5 MG PO TABS: 7.5000 mg | ORAL_TABLET | Freq: Every day | ORAL | 0 refills | Status: DC

## 2020-09-04 NOTE — Telephone Encounter (Signed)
Lm informing patient rx was sent to pharmacy.  Nothing Further Needed at this time.

## 2020-09-04 NOTE — Telephone Encounter (Signed)
Pt calling She would like to try buspar instead of the cymbalta. She is not having much relief from her panic attacks, and it is making her very sleepy.  She states she discussed this with you at the last visit. Are you ok with switching the medication?   [facility] [address]   cb [phone]

## 2020-09-04 NOTE — Telephone Encounter (Signed)
Ok I have sent low dose buspar she can start

## 2020-09-10 ENCOUNTER — Ambulatory Visit (INDEPENDENT_AMBULATORY_CARE_PROVIDER_SITE_OTHER): Payer: BC Managed Care – PPO | Admitting: Licensed Clinical Social Worker

## 2020-09-10 DIAGNOSIS — F4001 Agoraphobia with panic disorder: Secondary | ICD-10-CM

## 2020-09-10 DIAGNOSIS — F411 Generalized anxiety disorder: Secondary | ICD-10-CM
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
## 2020-09-20 ENCOUNTER — Ambulatory Visit (HOSPITAL_COMMUNITY): Payer: BC Managed Care – PPO | Admitting: Licensed Clinical Social Worker

## 2020-09-23 ENCOUNTER — Ambulatory Visit (INDEPENDENT_AMBULATORY_CARE_PROVIDER_SITE_OTHER): Payer: BC Managed Care – PPO | Admitting: Licensed Clinical Social Worker

## 2020-09-23 DIAGNOSIS — F411 Generalized anxiety disorder: Secondary | ICD-10-CM

## 2020-09-23 DIAGNOSIS — F4001 Agoraphobia with panic disorder: Secondary | ICD-10-CM | POA: Diagnosis not present

## 2020-09-23 NOTE — Progress Notes (Signed)
Virtual Visit via Video Note  I connected with Crystal Alexander on [birth year] at 11:00 AM EDT by a video enabled telemedicine application and verified that I am speaking with the correct person using two identifiers.  Location: Patient: home Provider: home office   I discussed the limitations of evaluation and management by telemedicine and the availability of in person appointments. The patient expressed understanding and agreed to proceed.  I discussed the assessment and treatment plan with the patient. The patient was provided an opportunity to ask questions and all were answered. The patient agreed with the plan and demonstrated an understanding of the instructions.   The patient was advised to call back or seek an in-person evaluation if the symptoms worsen or if the condition fails to improve as anticipated.  I provided 54 minutes of non-face-to-face time during this encounter.  THERAPIST PROGRESS NOTE  Session Time: 11:00 AM to 11:54 AM  Participation Level: Active  Behavioral Response: CasualAlertAnxious  Type of Therapy: Individual Therapy  Treatment Goals addressed:  Patient wants to feel at peace and feel normal again, reduce anxiety, panic, coping Interventions:  Patient wants to feel at peace and feel normal again, reduce anxiety, panic, coping  Summary: Crystal Alexander is a 40 y.o. female who presents with not sleeping well, had a couple of panic attacks, so discouraging. Trying hard to do meditation. As far as sleep always been that way mind racing, has been drifting off and then anxiety, can't breath, grasp for air. Not sure if Is post nasal drip has allergies, and asthma, is it panic attack waking her up at night happening a lot so affecting her sleep. Explored her anxiety-not working and really bothering her, worries about things such as her clients letting her go. Has been a month and keep putting them off. Dizziness what is meniere's woke up dizzy, ear full and  tinnitus is loud. Part-time work as massage, physical even when sitting is off due to dizziness makes her panicky. Anxiety feeds the dizziness.  Therapist pointed out she is also taking care of her mom and that is stressful and she agrees. Has have heavy periods polycystic ovarian syndrome, went for a ultrasound and found a fibroid. Wasn't ideal. Know that people have them knows hers is small, don't know what it is until removed fear of surgery. Freaked out because this growing inside her having a panic attack after told her. Panic attack when taking blood. [employer] therapist noted the down side she goes to the worst case scenario patient also pointed out the positive helps her to be a good advocate for herself. Mom had polyps back in the 30's and probably anxiety. Had hysterectomy. Patient's is a small fibroid at this point. Won't know unless biopsy. Can be monitored.  Her doctor was dismissive made it worse patient will look for somebody who will take her concerns into account. Patient shares she will fight for health even though scared. Noted lot of her anxiety has to do with health. She "googles"  to advocate for herself stumbles on the worst and holds on to that. Coping she thinks pretty well with medical issues in an anxious state and don't want to take two steps backwards.  Describes her panic attack back hot, dizzy eyes blur. Had a panic attack on way back from doctor's appointment as well so overwhelmed. Trying to talk to herself it is little, monitor it will be ok. Explored sources for her anxiety and patient underlying source she believes is her ex-boyfriend  passed away. Become tearful and shares it is hard to cope with. In her mind pops up often thinks about it. Thinks the root of things, symptoms when she got worse. Not with him since 2016 was with him 11 years. Cheated on him with wife. At that point drinking young not taking these things so significantly and it was more than that. Loved each equally,  grown apart but not with him. Breaking up with him hard, regrets but love her wife. Tough place what would life be like what if didn't break up would have children. He died of MRSA.He ended up on heroin after broke up. There is guilt for patient. Didn't take care of himself thinks. Family said he had been good. Hospital missed MRSA and could have treated septus and died. The negligence of it makes it hard. Doesn't feel depressed feel joy in her mind can't help but pop up in his mind memories of him even when he was alive. Hard for not everything to remind her of him. Her mind wants to fix stuff. Think don't have enough self-esteem. Patient processed with therapist and recognizes she needs to work on quit living in past and living in future and then not in present what is real. Noted that therapist recommendation of sighing helped with panic.  She is interested in resources and plan is to continue to work on book "Gummies for mindfulness". Therapist reviewed symptoms, facilitated expression of thoughts and feelings utilize processing of feelings to help with source of patient's anxiety and panic as stressors.  Noted knowing the source can sometimes help decrease symptoms.  Reviewed passage and anxiety book why patient doing relaxation every day is helpful if she is getting to the heart of what the sources as well the sympathetic nervous system and being an fighter flight and relaxation will help slow that down to help calm her down.  Also encourage patient with attitude that this will happen over time gradually that this cannot be fixed overnight.  Encourage mindfulness and why helpful patient has mindfulness book to begin to notice more her thoughts so she can do something about it note when she is in the future the past, in the future thinking of worst-case scenario and beginning to rationally challenge.  No patient doing her own education will help her making progress and already an indication of making progress  on goals.  Noted patient having a lot of stress right now contributing to anxiety, health issues contribute working on patient more rationally assessing not going to worst case scenario.  Work with patient on deep source for anxiety is loss of her ex-boyfriend, working on her guilt, with her what of thinking recognizing she cannot control the universe, she made best decision she could, can go back and see what might of been different but easy to do after things have happened, and we know how things turn out.  Urged patient to stay in the present where she has something she can do both can change the past and future has not happened.  Encourage patient with self compassion as significant for her help with symptoms.  Therapist provided active listening, open questions, supportive interventions.  Therapist provided insight to help patient with good reduction of symptoms includes recognizing things are not as scary as her brain thinks they are. Suicidal/Homicidal: No  Plan: Return again in 1 week.2.  Patient continue with relaxation exercise, look at mindfulness for dummies book, work on stress management to decrease anxiety, continue with panic  chapter and anxiety book as needed, look at phone resources for mindfulness, look at anxiety in CBT [place]  Diagnosis: Axis I: panic with agoraphobia, generalized anxiety disorder    Axis II: No diagnosis    Cordella Register, LCSW 09/23/2020

## 2020-09-26 ENCOUNTER — Other Ambulatory Visit (HOSPITAL_COMMUNITY): Payer: Self-pay | Admitting: Psychiatry

## 2020-09-30 ENCOUNTER — Ambulatory Visit (HOSPITAL_COMMUNITY): Payer: BC Managed Care – PPO | Admitting: Licensed Clinical Social Worker

## 2020-09-30 ENCOUNTER — Ambulatory Visit (INDEPENDENT_AMBULATORY_CARE_PROVIDER_SITE_OTHER): Payer: BC Managed Care – PPO | Admitting: Otolaryngology

## 2020-09-30 ENCOUNTER — Other Ambulatory Visit: Payer: Self-pay

## 2020-09-30 DIAGNOSIS — H9312 Tinnitus, left ear: Secondary | ICD-10-CM | POA: Diagnosis not present

## 2020-09-30 DIAGNOSIS — H8102 Meniere's disease, left ear: Secondary | ICD-10-CM

## 2020-09-30 DIAGNOSIS — H9041 Sensorineural hearing loss, unilateral, right ear, with unrestricted hearing on the contralateral side: Secondary | ICD-10-CM | POA: Diagnosis not present

## 2020-09-30 MED ORDER — TRIAMTERENE-HCTZ 37.5-25 MG PO CAPS: 1.0000 | ORAL_CAPSULE | Freq: Every day | ORAL | 6 refills | Status: DC

## 2020-09-30 NOTE — Addendum Note (Signed)
Addended by: Melony Overly E on: 09/30/2020 03:51 PM   Modules accepted: Orders

## 2020-09-30 NOTE — Progress Notes (Signed)
HPI: Crystal Alexander is a 40 y.o. female who returns today for evaluation of dizziness and ringing which has been worse in the left ear.  She was initially followed by Dr. Ernesto Rutherford and diagnosed with Mnire's disease in 2002 at which time she had an MRI scan.  She had a subsequent MRI scan performed in 2013 because of diagnosis of Mnire's disease and ringing in the left ear according to the patient although I cannot find the results of the MRI scan in epic.  Patient thought that she had MRI scan performed in [facility]. She has been having worsening of her tinnitus in the left ear has also had slightly worse dizziness although she does not really describe vertigo.  Past Medical History:  Diagnosis Date   Abnormal Pap smear of cervix    3/99 cin2 &3   Asthma    Fibroadenoma of breast     breast biopsy x 2   HPV (human papilloma virus) infection    Meniere's disease    Past Surgical History:  Procedure Laterality Date   CERVICAL BIOPSY  W/ LOOP ELECTRODE EXCISION  1999   negative margins   LEEP     Social History   Socioeconomic History   Marital status: Married    Spouse name: Not on file   Number of children: Not on file   Years of education: Not on file   Highest education level: Not on file  Occupational History   Not on file  Tobacco Use   Smoking status: Former    Pack years: 0.00    Types: Cigarettes   Smokeless tobacco: Never  Substance and Sexual Activity   Alcohol use: Yes    Alcohol/week: 20.0 standard drinks    Types: 20 Cans of beer per week    Comment: in a month   Drug use: No   Sexual activity: Yes    Partners: Female    Birth control/protection: None    Comment: Female partner  Other Topics Concern   Not on file  Social History Narrative   Not on file   Social Determinants of Health   Financial Resource Strain: Not on file  Food Insecurity: Not on file  Transportation Needs: Not on file  Physical Activity: Not on file  Stress: Not on file   Social Connections: Not on file   Family History  Problem Relation Age of Onset   Hypertension Mother    Parkinson's disease Mother    Diabetes Father    Hypertension Paternal Grandmother    No Known Allergies Prior to Admission medications   Medication Sig Start Date End Date Taking? Authorizing Provider  ALPRAZolam (XANAX) 0.25 MG tablet TAKE 1 TABLET BY MOUTH DAILY AS NEEDED FOR ANXIETY 09/03/20   Merian Capron, MD  busPIRone (BUSPAR) 7.5 MG tablet Take 1 tablet (7.5 mg total) by mouth daily. 09/04/20 09/04/21  Merian Capron, MD  DULoxetine (CYMBALTA) 20 MG capsule TAKE 1 CAPSULE BY MOUTH EVERY DAY 08/06/20   Merian Capron, MD  fluticasone (FLOVENT HFA) 110 MCG/ACT inhaler Inhale 1 puff into the lungs daily.    [provider]  metFORMIN (GLUCOPHAGE) 1000 MG tablet Take 1,000 mg by mouth 2 (two) times daily. 12/03/19   [provider]     Positive ROS: Otherwise negative  All other systems have been reviewed and were otherwise negative with the exception of those mentioned in the HPI and as above.  Physical Exam: Constitutional: Alert, well-appearing, no acute distress Ears: External ears  without lesions or tenderness. Ear canals are clear bilaterally with intact, clear TMs bilaterally. Nasal: External nose without lesions. Septum with minimal deformity and mild rhinitis. Clear nasal passages Oral: Lips and gums without lesions. Tongue and palate mucosa without lesions. Posterior oropharynx clear. Neck: No palpable adenopathy or masses Respiratory: Breathing comfortably  Skin: No facial/neck lesions or rash noted.  Audiologic testing in the office today demonstrated a pronounced left ear downsloping sensorineural hearing loss.  She had a mild downsloping sensorineural hearing loss in the left ear performed in 2013.  This is significantly worse on the audiologic testing today.  She had type A tympanograms bilaterally.  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 Questionable Mnire's disease.  Plan: She would like to try the diuretic to see if this helps at all with her dizziness.  Prescribed Dyazide 1 every morning. Briefly discussed with her concerning repeat MRI scan to evaluate for cochlear or retrocochlear pathology but she is hesitant about having another MRI scan performed.  She has anxiety and is not sure she can have MRI scan in a closed system. We will initially try her on Dyazide.   Radene Journey, MD

## 2020-10-01 ENCOUNTER — Other Ambulatory Visit (HOSPITAL_COMMUNITY): Payer: Self-pay | Admitting: *Deleted

## 2020-10-01 MED ORDER — BUSPIRONE HCL 7.5 MG PO TABS: 7.5000 mg | ORAL_TABLET | Freq: Every day | ORAL | 0 refills | Status: DC

## 2020-10-02 ENCOUNTER — Ambulatory Visit (INDEPENDENT_AMBULATORY_CARE_PROVIDER_SITE_OTHER): Payer: BC Managed Care – PPO | Admitting: Licensed Clinical Social Worker

## 2020-10-02 DIAGNOSIS — F411 Generalized anxiety disorder: Secondary | ICD-10-CM | POA: Diagnosis not present

## 2020-10-02 DIAGNOSIS — F4001 Agoraphobia with panic disorder: Secondary | ICD-10-CM | POA: Diagnosis not present

## 2020-10-02 NOTE — Progress Notes (Signed)
Virtual Visit via Video Note  I connected with Crystal Alexander on 74[birth year] at  3:00 PM EDT by a video enabled telemedicine application and verified that I am speaking with the correct person using two identifiers.  Location: Patient: home Provider: home office   I discussed the limitations of evaluation and management by telemedicine and the availability of in person appointments. The patient expressed understanding and agreed to proceed.  I discussed the assessment and treatment plan with the patient. The patient was provided an opportunity to ask questions and all were answered. The patient agreed with the plan and demonstrated an understanding of the instructions.   The patient was advised to call back or seek an in-person evaluation if the symptoms worsen or if the condition fails to improve as anticipated.  I provided 54 minutes of non-face-to-face time during this encounter.  THERAPIST PROGRESS NOTE  Session Time: 3:00 PM to 3:54 PM  Participation Level: Active  Behavioral Response: CasualAlertAnxious  Type of Therapy: Individual Therapy  Treatment Goals addressed:  Patient wants to feel at peace and feel normal again, reduce anxiety, panic, coping  Interventions: CBT, DBT, Solution Focused, Supportive, Reframing, and Other: copoing  Summary: Crystal Alexander is a 40 y.o. female who presents with she is doing ok. Having dizziness Meniere's brought a lot of anxiety dizziness and off of balance. Went to ENT he wasn't good, panic in hearing test, had to get out finding out very claustrophobic got through it, gets disappointed in herself. Embarrassed and feel like making scene. Freaked out and don't feel normal weird. This fees into symptoms.  Noted girlfriend pointed out that she got through it also not be so concerned about what other people think and what she thinks is an accurate people are not so focused on her. Have to have an MRI next thing had to with Meniere's. Worst anxiety  of life when went to have a MRI. Wants to get an MRI to give her peace to rule out MS and tumor doctor also recommending it due to hearing loss. Lost hearing significant in left ear. Had loss some at 30 but doctor said a huge slope. Want to know what it is going on with hearing loss. Wants to work on today how to cope and get through MRI. Taking a full Xanax. Generally take .25mg  for MRI but take the whole. Expected to be an hour. Will take some with her as back up. Tries to work on thoughts as passing cloud let it pass by. Asks herself why she freaked out in the box when she had hearing test and freaked out. Was a closed space has not been claustrophobic before it does bother her she does not have air flowing, she has asthma.  Noted probably patient fed into the thought of not hearing things that escalated her panic as a source for this. Working on dealing with MRI tell herself in a big room. Working on [employer], she notices she dives into a thought and is in it rather been being able to unhook from it and it escalates quickly. Don't know why claustrophobic wasn't before.  She is going to put herself into situations that are closed and to get used to it and therapist said exposure therapy is very helpful so good she is going to do that. She feels in these moments she doesn't have time to try to use logic.  Wife driving negative thoughts building push it away try to think about something focus on breath too  many things she puts her focus on and that doesn't help. Does help when in it take the deep breath. Resets her in a sense, paniky but not escalating. Sometimes if stopping notice the thought and let it pass. One thought though leads to adrenaline rush. She always thought she has been In touch with body and when thoughts come believe because in touch. Reviewed thoughts feeding panic she said she thought "Am I going to have an medical emergency and embarrass myself?" Use aromatherapy vics inhaler up her  nose lavender and cedar wood help calming. Buspar not working may need more. Reviewed session and patient said good to explore doing mindfulness besides the practice doing it and regular daily activities not just when meditating. With her negative thoughts get caught up the cycle. Have the negative what can say to herself to get out of it. Thought stopping something she has looked at. Doesn't know what that the thing will be we will keep exploring to pull her away from panic.   Therapist reviewed symptoms, facilitated expression of thoughts and feelings and worked with patient on having an upcoming MRI how she could work on it so she will be less anxious.  Noted she is going to expose herself to situation where she is closed and therapist said that is a very effective way to help decrease anxiety, aromatherapy helpful and she will have meds and backup meds that will help reduce anxiety.  Therapist work with patient on self talk assessing things accurately this is a test is not going to hurt her reinforcing her thoughts do not tell us the truth they are neurochemical signals not actually fax and to counter her thoughts with logic like that.  Sometimes helpful to have a short hand communication with our brains that helps his calm down.  Noted significance of how patient's bunion years disease impacts her anxiety feeling not right and dizzy and this being significant insight.  Noted is well the process of therapy of coming across insights that we will help patient with reduction of symptoms.  Note will patient working on things herself help with progress by reading books, also encourage regular practice of mindfulness will help her get better.  Noted mindfulness helpful for stopping unhelpful thought patterns, helping her to unhook from negative thoughts and reducing to worksheets on mindfulness to give more explanation on it.  Therapist provided active listening open questions, supportive  interventions. Suicidal/Homicidal: No  Plan: Return again in 3 weeks.2.  Patient practice mindfulness outside of meditation to help her with skill, therapist encourage patient with thought challenging and not believing her thoughts as aspect of working on coping skills but also working with patient her own insights that will help with coping for her symptoms  Diagnosis: Axis I: panic with agoraphobia, generalized anxiety disorder    Axis II: No diagnosis    Cordella Register, LCSW 10/02/2020

## 2020-10-14 ENCOUNTER — Encounter (HOSPITAL_COMMUNITY): Payer: Self-pay | Admitting: Psychiatry

## 2020-10-14 ENCOUNTER — Telehealth (INDEPENDENT_AMBULATORY_CARE_PROVIDER_SITE_OTHER): Payer: BC Managed Care – PPO | Admitting: Psychiatry

## 2020-10-14 DIAGNOSIS — F411 Generalized anxiety disorder: Secondary | ICD-10-CM

## 2020-10-14 DIAGNOSIS — F4001 Agoraphobia with panic disorder: Secondary | ICD-10-CM

## 2020-10-14 MED ORDER — ALPRAZOLAM 0.25 MG PO TABS
0.2500 mg | ORAL_TABLET | Freq: Every day | ORAL | 0 refills | Status: DC | PRN
Start: 2020-10-14 — End: 2020-11-11

## 2020-10-14 NOTE — Progress Notes (Signed)
[facility] Follow up visit   Patient Identification: Crystal Alexander MRN:  [id number] Date of Evaluation:  10/14/2020 Referral Source: primary care Chief Complaint: follow up anxiety, panic attacks Visit Diagnosis:    ICD-10-CM   1. Panic disorder with agoraphobia  F40.01     2. GAD (generalized anxiety disorder)  F41.1      Virtual Visit via Video Note  I connected with Crystal Alexander on 06/24/20 at 11:15 AM EDT by a video enabled telemedicine application and verified that I am speaking with the correct person using two identifiers.  Location: Patient: home Provider: home office   I discussed the limitations of evaluation and management by telemedicine and the availability of in person appointments. The patient expressed understanding and agreed to proceed.     I discussed the assessment and treatment plan with the patient. The patient was provided an opportunity to ask questions and all were answered. The patient agreed with the plan and demonstrated an understanding of the instructions.   The patient was advised to call back or seek an in-person evaluation if the symptoms worsen or if the condition fails to improve as anticipated.  I provided 12  minutes of non-face-to-face time during this encounter.      History of Present Illness: Patient is a 40 years old married Caucasian female she works as a Geophysicist/field seismologist.  Her parents live in the same street she takes care of her mom who has parkinsonism.  Referred initially  by primary care physician for establish care for anxiety and panic attacks  Cymbalta was also giving her side effects she stopped it.  BuSpar was making her sedated or foggy so she is taking at night but she still remains anxious she is worried about having an MRI but she is not taking Xanax on a regular basis she has Mnire's disease as well autoimmune disease Crystal Alexander is helping in therapy to work on breathing techniques  Patient is trying to go back to do  massage therapy but still anxious  Her wife is supportive.  Aggravating factors; pandemic, mom has parkinsonism.  Medical comorbidity Modifying factors; wife    Past Psychiatric History: anxiety, panic attacks  Previous Psychotropic Medications: Yes   Substance Abuse History in the last 12 months:  No.  Consequences of Substance Abuse: NA  Past Medical History:  Past Medical History:  Diagnosis Date   Abnormal Pap smear of cervix    3/99 cin2 &3   Asthma    Fibroadenoma of breast     breast biopsy x 2   HPV (human papilloma virus) infection    Meniere's disease     Past Surgical History:  Procedure Laterality Date   CERVICAL BIOPSY  W/ LOOP ELECTRODE EXCISION  1999   negative margins   LEEP      Family Psychiatric History: mom: anxiety  Family History:  Family History  Problem Relation Age of Onset   Hypertension Mother    Parkinson's disease Mother    Diabetes Father    Hypertension Paternal Grandmother     Social History:   Social History   Socioeconomic History   Marital status: Married    Spouse name: Not on file   Number of children: Not on file   Years of education: Not on file   Highest education level: Not on file  Occupational History   Not on file  Tobacco Use   Smoking status: Former    Pack years: 0.00    Types: Cigarettes  Smokeless tobacco: Never  Substance and Sexual Activity   Alcohol use: Yes    Alcohol/week: 20.0 standard drinks    Types: 20 Cans of beer per week    Comment: in a month   Drug use: No   Sexual activity: Yes    Partners: Female    Birth control/protection: None    Comment: Female partner  Other Topics Concern   Not on file  Social History Narrative   Not on file   Social Determinants of Health   Financial Resource Strain: Not on file  Food Insecurity: Not on file  Transportation Needs: Not on file  Physical Activity: Not on file  Stress: Not on file  Social Connections: Not on file      Allergies:  No Known Allergies  Metabolic Disorder Labs: Lab Results  Component Value Date   HGBA1C 5.3 12/26/2018   MPG 108 08/21/2015   Lab Results  Component Value Date   PROLACTIN 13.1 08/07/2015   Lab Results  Component Value Date   CHOL 225 (H) 12/26/2018   TRIG 223 (H) 12/26/2018   HDL 43 12/26/2018   CHOLHDL 5.2 (H) 12/26/2018   VLDL 28 08/07/2015   LDLCALC 142 (H) 12/26/2018   LDLCALC 153 (H) 08/07/2015   Lab Results  Component Value Date   TSH 3.240 12/26/2018    Therapeutic Level Labs: No results found for: LITHIUM No results found for: CBMZ No results found for: VALPROATE  Current Medications: Current Outpatient Medications  Medication Sig Dispense Refill   ALPRAZolam (XANAX) 0.25 MG tablet Take 1 tablet (0.25 mg total) by mouth daily as needed. for anxiety 30 tablet 0   busPIRone (BUSPAR) 7.5 MG tablet Take 1 tablet (7.5 mg total) by mouth daily. 30 tablet 0   DULoxetine (CYMBALTA) 20 MG capsule TAKE 1 CAPSULE BY MOUTH EVERY DAY 90 capsule 1   fluticasone (FLOVENT HFA) 110 MCG/ACT inhaler Inhale 1 puff into the lungs daily.     metFORMIN (GLUCOPHAGE) 1000 MG tablet Take 1,000 mg by mouth 2 (two) times daily.     triamterene-hydrochlorothiazide (DYAZIDE) 37.5-25 MG capsule Take 1 each (1 capsule total) by mouth daily. 30 capsule 6   No current facility-administered medications for this visit.      Psychiatric Specialty Exam: Review of Systems  Cardiovascular:  Negative for chest pain.  Psychiatric/Behavioral:  Negative for agitation and self-injury.    There were no vitals taken for this visit.There is no height or weight on file to calculate BMI.  General Appearance: Casual  Eye Contact:  Fair  Speech:  Clear and Coherent  Volume:  Normal  Mood: Anxious  Affect:  Full Range  Thought Process:  Goal Directed  Orientation:  Full (Time, Place, and Person)  Thought Content:  Rumination  Suicidal Thoughts:  No  Homicidal Thoughts:  No   Memory:  Immediate;   Fair Recent;   Fair  Judgement:  Fair  Insight:  Fair  Psychomotor Activity:  Normal  Concentration:  Concentration: Fair and Attention Span: Fair  Recall:  Good  Fund of Knowledge:Good  Language: Good  Akathisia:  No  Handed:   AIMS (if indicated):  not done  Assets:  Communication Skills Desire for Improvement Financial Resources/Insurance Social Support  ADL's:  Intact  Cognition: WNL  Sleep:   variable   Screenings: Web designer from 08/21/2020 in [facility] Video Visit from 07/12/2020 in [facility]  PHQ-2 Total Score 2 0  PHQ-9 Total Score 7 --      Flowsheet Row Video Visit from 10/14/2020 in [facility] Counselor from 08/21/2020 in [facility] Video Visit from 08/19/2020 in [place] [facility] No Risk No Risk No Risk       Assessment and Plan: as follows Prior documentation reviewed Panic disorder with agoraphobia;  Remains anxious recommend to take Xanax small dose regularly rather than wait for having a panic attack continue to work on therapy to work on breathing techniques she can take an extra Xanax for her MRI continue BuSpar at night   generalized anxiety disorder; remains anxious Xanax does help recommend to take it regularly   follow-up in 2 months or earlier if needed   Merian Capron, MD 7/11/202211:27 AM

## 2020-10-21 ENCOUNTER — Telehealth (HOSPITAL_COMMUNITY): Payer: BC Managed Care – PPO | Admitting: Psychiatry

## 2020-10-23 ENCOUNTER — Ambulatory Visit (HOSPITAL_COMMUNITY): Payer: BC Managed Care – PPO | Admitting: Licensed Clinical Social Worker

## 2020-10-30 ENCOUNTER — Other Ambulatory Visit (HOSPITAL_COMMUNITY): Payer: Self-pay | Admitting: Psychiatry

## 2020-10-31 NOTE — Telephone Encounter (Signed)
Ordered

## 2020-11-05 ENCOUNTER — Ambulatory Visit (INDEPENDENT_AMBULATORY_CARE_PROVIDER_SITE_OTHER): Payer: BC Managed Care – PPO | Admitting: Licensed Clinical Social Worker

## 2020-11-05 DIAGNOSIS — F411 Generalized anxiety disorder: Secondary | ICD-10-CM

## 2020-11-05 DIAGNOSIS — F4001 Agoraphobia with panic disorder: Secondary | ICD-10-CM | POA: Diagnosis not present

## 2020-11-05 NOTE — Progress Notes (Signed)
Virtual Visit via Video Note  I connected with Crystal Alexander on [id number] at  4:00 PM EDT by a video enabled telemedicine application and verified that I am speaking with the correct person using two identifiers.  Location: Patient: home Provider: home office   I discussed the limitations of evaluation and management by telemedicine and the availability of in person appointments. The patient expressed understanding and agreed to proceed.   I discussed the assessment and treatment plan with the patient. The patient was provided an opportunity to ask questions and all were answered. The patient agreed with the plan and demonstrated an understanding of the instructions.   The patient was advised to call back or seek an in-person evaluation if the symptoms worsen or if the condition fails to improve as anticipated.  I provided 56 minutes of non-face-to-face time during this encounter.  THERAPIST PROGRESS NOTE  Session Time: 4:00 PM to 4:56 PM  Participation Level: Active  Behavioral Response: CasualAlertAnxious  Type of Therapy: Individual Therapy  Treatment Goals addressed:  Patient wants to feel at peace and feel normal again, reduce anxiety, panic, coping Interventions: CBT, Solution Focused, Strength-based, Supportive, and Other: coping  Summary: Crystal Alexander is a 40 y.o. female who presents with was feeling better, prescribed her to take Xanax a day takes .25 takes half in the morning of that and 4 hours later can feel it building back inside her. Finding it was providing relief. Wife is big support and driving, patient has been dizzy with Meniere's disease. Wife had double vision thought TMJ has headaches and migraines. Found out it is a condition build up of cerebral fluid in brain pushing on optic nerve could have had a aneurysm or brain tumor. Feels taking fifty steps backward with anxiety. Wife has had MRI none of the scary stuff. Does have a neurology appointment. Patient  upset because she isn't driving she can't drive. Sister is going to help out. Shared her thoughts that they are too young for something going on like this. Caught patient in weird place. Wife will probably need a spinal tap to release pressure. She has idiopathetic intercranial hypertension. 1 in 100,000 people can get it. Could be because of birth control. Angry about it. She has high blood pressure. Not in terrible health happened last Wednesday. Patient said was scared, in a crappy place mentally hard to see her there because wife has been her rock. Did help her un-focus on her health then obsessed with her health.  Floored this with patient helping her focus on things such as did find out what probably is the best case scenario for diagnosis. Still not quite sure how this will unfold could be as simple as diuretic various levels of scariness could be shunt in brain. Saying to her what therapist saying to her. Knows what is rational, but within patient's own brain "reeling with it" in her mind but want to comfort her. Bad mental place. Think went further back because of this. Thinks is the control wants to know the facts. Feels like in place doesn't know who she is. Know more when see  neurologist tomorrow. Discussed distraction and she is playing video games. Do something that brings her joy and has helped. Was working on MRI thinking of being claustrophobic, afraid to go anywhere. Putting it off can't do it all, see what happen with her and then schedule MRI. Not as concerned about her health. Concerned about her anxiety. She describes nerves shot, anxiety causing physical shaking.  Never been like this. Dealt with traumatic things. Video game is helping, it includes tasks and relaxing. Reviewed difficulty happening from progression of events. Had the dizziness thing spiraled not feeling like driving or massage, found out low iron and ferratin-shows iron reserves, has POCS heavy periods, has never been so  fatigued taking iron helped with fatigue B Vitamin complex help with stress not with dizziness. Stress makes Meniere's worse, dizziness makes stress worse, patient has more anxiety right now. Thought was feeling better. Doctor told her to take Xanax everyday not just when needed. It helped her not to think of crazy thoughts, didn't hold on to thoughts, Buspar made body feel weird and Xanax didn't do that. Reading "Hope and Help for your Nerves" by. Terald Sleeper and helping. Really believes to learn things to help herself. Own resources to heal. She has a book on  agoraphobia wants to read. Was trying exposure therapy take out driving but now wife can't drive. Wishes she could catch a bit of peace to come out of this-worried about health, dizziness, work, what is going on with wife, Dad gallbladder surgery little things usually can deal with. Wishes her brain shut up and be. Does a meditation feels a new woman. Nervous system needs rest. Meditation on "Head Space" and another app "Balance" doesn't last long. Body scanning and belly breathing. Can Zen out but not for long and monkey mind keeps going.  Therapist noted positive that she is able do meditation and continue to practice it to build up skill may be helpful.  Therapist reviewed symptoms, facilitated expression of thoughts and feelings work with patient's anxiety related to current stressor for her partner and her medical issues reminding patient not to go into the future if things that not happen and thinking the worst case scenario, causing her extra stress that may not be needed not helpful for her.  At the same time validated patient recognizing the emotional aspect that can be difficult to do that.  Discussed distraction is helpful.  Reviewed accumulation of stress for patient that has worsened symptoms and discussed completing the stress cycle is helpful to stop accumulation of stress doing activities that the body or stands to destress such as  physical activity, creativity.  Noted helpful to do every day so deactivate's daily stress.  Noted very insightful comment of patient from reading her book of finding her own resources to heal is very helpful in helping her own recovery.  Patient's homework is to share insights she gets from book as she finds it helpful that she can apply for coping.  Therapist also reminds patient she wants to control things that are not feasible in life recognize that and have more acceptance could be helpful.  We will continue to work on patient not entertaining thoughts that are not accurate and helpful.  Therapist provided active listening, open questions, supportive interventions Suicidal/Homicidal: No  Plan: Return again in  1week. 2.  Explore with patient insight from book she is found helpful "Hope and Help from your Nerves" 2.  Look at generalized anxiety disorder in children's book for some insights, psychology tolls on anxiety, Center for clinical innervations-generalized anxiety disorder, look at anxiety book on self talk or other chapters in [place], work with patient on disregarding unhelpful thoughts Diagnosis: Axis I: panic with agoraphobia, generalized anxiety disorder    Axis II: No diagnosis    Cordella Register, LCSW 11/05/2020

## 2020-11-07 ENCOUNTER — Encounter (INDEPENDENT_AMBULATORY_CARE_PROVIDER_SITE_OTHER): Payer: Self-pay

## 2020-11-10 ENCOUNTER — Other Ambulatory Visit (HOSPITAL_COMMUNITY): Payer: Self-pay | Admitting: Psychiatry

## 2020-11-15 ENCOUNTER — Ambulatory Visit (INDEPENDENT_AMBULATORY_CARE_PROVIDER_SITE_OTHER): Payer: BC Managed Care – PPO | Admitting: Licensed Clinical Social Worker

## 2020-11-15 ENCOUNTER — Other Ambulatory Visit: Payer: Self-pay

## 2020-11-15 DIAGNOSIS — F4001 Agoraphobia with panic disorder: Secondary | ICD-10-CM

## 2020-11-15 DIAGNOSIS — F411 Generalized anxiety disorder: Secondary | ICD-10-CM | POA: Diagnosis not present

## 2020-11-15 NOTE — Progress Notes (Addendum)
Virtual Visit via Video Note  I connected with Krystalynn Nolt on [id number] at 11:00 AM EDT by a video enabled telemedicine application and verified that I am speaking with the correct person using two identifiers.  Location: Patient: home Provider: home office   I discussed the limitations of evaluation and management by telemedicine and the availability of in person appointments. The patient expressed understanding and agreed to proceed.   I discussed the assessment and treatment plan with the patient. The patient was provided an opportunity to ask questions and all were answered. The patient agreed with the plan and demonstrated an understanding of the instructions.   The patient was advised to call back or seek an in-person evaluation if the symptoms worsen or if the condition fails to improve as anticipated.  I provided 55 minutes of non-face-to-face time during this encounter.  THERAPIST PROGRESS NOTE  Session Time: 11:00 AM to 11:55 AM  Participation Level: Active  Behavioral Response: CasualAlertAnxious  Type of Therapy: Individual Therapy  Treatment Goals addressed:  Patient wants to feel at peace and feel normal again, reduce anxiety, panic, coping Interventions: CBT, DBT, Solution Focused, Strength-based, Supportive, and Other: coping  Summary: Crystal Alexander is a 40 y.o. female who presents with wife did have to have a spinal tap, patient was very nervous and not herself, strong caretaker at a place thought where any little stress can be too much can't watch a dramatic show for example. Wife had to lay down 24-48 hours after the test so anxiety about the risk of the test but also finding out what is going on. Nothing found. Dizziness thinks that started the anxiety and low iron. Would call it a mental breakdown though not that dramatic not comfortable leaving the house, driving.  Anxiety makes the dizziness work avoid anything that could case anxiety which makes dizziness  worse. Proud of herself rallied up to be caretaker. Was easy she was fine took care of her did meals so she could relax. Good for her helped to get away from herself to focus on her. Wife has another MRI on Sunday idiopathic don't know why it happened. Check the drainage system of the brain. Figure out why fluid not draining. Been through a few things so better place but still serious stuff. Vision hasn't returned and that worries her. With her wife cerebral spinal fluid too much. Thought just dealing with her mental stuff and this pops up and adds to the stack. In a small way try to focus less on herself and focus on her and has got  out of head little bit. Leaving the house but only stay within the block. Did something she felt proud of yesterday. Realized needed to get out or get worse. Went to [place] which is within a block of where she lives. Wanted to get in somewhere and didn't stay long. Had one bad panic attack since talking to therapist and think taking Xanax regularly now is helping some. In the evening when wears off. Ate a meal got nauseous made her anxious felt like going to throw up. Had a panic attack. Laid down and deep breathing for ten minutes to come out of it. She was  thinking having a heart attack. Thought to herself having one nothing can do if need to call 911. Let see if get through before call 911. Tried to talk herself off the edge heart pounding out of the chest haven't felt that before so scared her, breathless but does have  asthma. Talked about letting it go that having a heart attack but in background don't believe what telling herself. Used to let go of thoughts that are catastrophizing can't do that now, panic attack in 20's have never had so many so keeping her down because afraid have another. Losing ex-boyfriend she thinks is one of the source, thought pushed away but sneaking up, didn't deal so hitting her hard. Sad and mad hard because he was young and loved him so much.  Nobody so close to her pass away. Took her to that place main one, dizziness mortality on her mind. Mortality of wife and what if lost her. Dizziness and hearing loss. Lost more hearing do another MRI make sure don't have tumor and MS. His passing darker place. MRI coming and causing anxiety, plan to take Xanax, belly breathing with be enclosed place for period of time.  Reminded her remember is just a test.  Does have tiny bit of depression, at the same time in this situation but can laugh, enjoy herself. Reviewed the container exercise for a resource patient said liked it and will put worries of MRI in there why think about it until live it.   Therapist reviewed symptoms, facilitated expression of thoughts and feelings and use this as a treatment intervention to work on stressors and anxiety.  Provided positive feedback for patient going outside house and going into a store as this is a strategy to help by not avoiding and slowly expose herself to things she fears and to continue to work on this.  Current strategies for managing anxiety including Worksheet "Four signs your panic attacks are just standard anxiety-there are symptoms of agoraphobia" emphasized avoiding certain triggers such as driving on the freeway or standing in line may provide temporary relief it eventually forms a pattern the dominates a person's life.  It really is about avoidance and patterns of avoidance that is an emotional regulation strategy so patient has to unlearn avoidance behaviors with CBT and slowly expose herself to triggers.  A mantra help her when scared to do something.  Continue to work with patient on refocusing and taking more control over how she reacts to thoughts.  May be just learn to notice the thoughts acknowledge them then let them pass.  Noticed the mind bullying let ago trigger focus of attention to something else.  First of all you might learn to focus on your breathing.  Introduce other simple mindfulness  activities such as paying attention to activities during the day noted underlying reason of mindfulness to train her brain of being able to focus so can recognize thoughts unhelpful challenge or let them go. Also reviewed exercise container that may be helpful in putting her thoughts in a container when feels distressed may help in that management of thoughts.  Therapist provided active listening open questions, supportive interventions Suicidal/Homicidal: No  Plan: Return again in 2 weeks.2.  Patient practice using container. 3.  Therapist introduced resource of restoration team, look at mind bully, general anxiety will in children's book, generalized anxiety disorder and Center for clinical interventions, look at anxiety book at self talk or other chapters, look at handout on anxiety in psychology tools, look at panic handout  Diagnosis: Axis I:  panic with agoraphobia, generalized anxiety disorder    Axis II: No diagnosis    Cordella Register, LCSW 11/15/2020

## 2020-11-26 ENCOUNTER — Other Ambulatory Visit (INDEPENDENT_AMBULATORY_CARE_PROVIDER_SITE_OTHER): Payer: Self-pay | Admitting: Otolaryngology

## 2020-11-26 DIAGNOSIS — H9042 Sensorineural hearing loss, unilateral, left ear, with unrestricted hearing on the contralateral side: Secondary | ICD-10-CM

## 2020-11-29 ENCOUNTER — Ambulatory Visit (HOSPITAL_COMMUNITY): Payer: BC Managed Care – PPO | Admitting: Licensed Clinical Social Worker

## 2020-12-01 ENCOUNTER — Ambulatory Visit
Admission: RE | Admit: 2020-12-01 | Discharge: 2020-12-01 | Disposition: A | Discharge status: Home / Self Care | Payer: BC Managed Care – PPO | Source: Ambulatory Visit | Attending: Otolaryngology | Admitting: Otolaryngology

## 2020-12-01 ENCOUNTER — Other Ambulatory Visit: Payer: Self-pay

## 2020-12-01 DIAGNOSIS — H9042 Sensorineural hearing loss, unilateral, left ear, with unrestricted hearing on the contralateral side: Secondary | ICD-10-CM

## 2020-12-01 MED ORDER — GADOBENATE DIMEGLUMINE 529 MG/ML IV SOLN
20.0000 mL | Freq: Once | INTRAVENOUS | Status: AC | PRN
  Administered 2020-12-01: 20 mL via INTRAVENOUS

## 2020-12-02 ENCOUNTER — Other Ambulatory Visit: Payer: BC Managed Care – PPO

## 2020-12-04 ENCOUNTER — Ambulatory Visit (INDEPENDENT_AMBULATORY_CARE_PROVIDER_SITE_OTHER): Payer: BC Managed Care – PPO | Admitting: Licensed Clinical Social Worker

## 2020-12-04 DIAGNOSIS — F411 Generalized anxiety disorder: Secondary | ICD-10-CM | POA: Diagnosis not present

## 2020-12-04 DIAGNOSIS — F4001 Agoraphobia with panic disorder: Secondary | ICD-10-CM

## 2020-12-04 NOTE — Progress Notes (Signed)
Virtual Visit via Video Note  I connected with Crystal Alexander on [id number] at 11:00 AM EDT by a video enabled telemedicine application and verified that I am speaking with the correct person using two identifiers.  Location: Patient: home Provider: home office   I discussed the limitations of evaluation and management by telemedicine and the availability of in person appointments. The patient expressed understanding and agreed to proceed.  I discussed the assessment and treatment plan with the patient. The patient was provided an opportunity to ask questions and all were answered. The patient agreed with the plan and demonstrated an understanding of the instructions.   The patient was advised to call back or seek an in-person evaluation if the symptoms worsen or if the condition fails to improve as anticipated.  I provided 54 minutes of non-face-to-face time during this encounter.  THERAPIST PROGRESS NOTE  Session Time: 11:00 AM to 11:54 AM  Participation Level: Active  Behavioral Response: CasualAlertAnxious and Dysphoric  Type of Therapy: Individual Therapy  Treatment Goals addressed:  Patient wants to feel at peace and feel normal again, reduce anxiety, panic, coping Interventions: CBT, Strength-based, Supportive, and Other: grief, coping  Summary: Crystal Alexander is a 40 y.o. female who presents with doing ok.  Noted the start of panic was when she had COVID shot but had it too soon so had immune reaction.  Noted this woke up her panic, noted thoughts she was telling herself from rapid heartbeat and feeling warm where she may have a blood clot.  Therapist noted this right away as what is feeding the panic noting her thoughts so that she can get better at noticing and challenging them.  No she will have the rest of her life and therapist qualified that that she had years where she did not patient recognizes this that stress usually brings them back.  Realizes a source of her panic is  losing ex-boyfriend that July. Wife's condition gotten worse her mother is staying with them. OK thinks because Xanax what is getting her through. Wife can't drive and patient not driving due to panic and dizzy. Wife has double vision. Has too much cerebral fluid in brain. Even talking about may make her cry. She is being juggled around doctors between neurology and ophthalmology  scared what is going to happen, she is her rock and patient now has to be the rock messed up but has to the rock. Forcing her to rally. Wonders how they got there both fine in April. Dizziness is causing her problems with driving, her Mnire's disease has flared up too much, cars driving by too much stimulation that makes her panicky. Heavy year. Flare up with her Meniere's. Still dizzy got her MRI had panic took her Xanax. That helped her through it also meditation helped her through. Heart pounding took minutes but calmed down 40 minutes for MRI. Patient relates she needs to say to herself If can do that can do more stuff. Has been doing meditation for 10 minutes Headspace since May. Because of the many panic attacks she could tell herself she would be ok. Have many and have been ok. MRI was good. Combination of dizzy can't handle weird feelings when nervous system is shot. Wife had to have an MRV. Did find narrowing of the veins. 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 May have stint or shunt put in. Mom is a Marine scientist. Rallying but  does throw her into panic. Logic did go through with MRI, you will be fine had this before, help her know can handle other situations. A thought will increase her anxiety, make her spiral, start to panic, for example not having inhaler before work out. Can't counteract it. Can't let it go. Things could get scary with asthma but sees she can make mountain out of mole hill. This thought leads to not being able to work out will have a  panic attack, in a cycle, knows it and can see it happen.  (Therapist thinks patient problem solving helpful though that she puts notes remember inhaler by her workout equipment) Death of boyfriend she relates how impacted she thinks is made her realize that death real and can come. Can get [facility] doctor failed to recognize and could die with it messed with her mind. Lack to trust of healthcare system if medical emergency.  Therapist encouraged letting go of what she cannot control.  Thought about him every day. Were together from ages [age]. Many memories. Sadness about it but having set aside worried about wife. Nothing can do about it focus on her life what have and what is ahead. Can't hold on to what happened. Died from wound infected.  Probably should have a had it looked at sooner  Therapist reviewed symptoms, facilitated expression of thoughts and feelings and utilize that as important treatment intervention help patient work through feelings also explore helpful coping strategies for anxiety.  Therapist noted in patient's own self talk that she goes to the worst case scenario and projects into the future and what she says to herself is important and how it feeds anxiety.  Noted we do not know what the future is going to be and does not help [place] to focus on future and functioning but focus more on the present, use logic to counter anxiety.  Noted at this point patient does not have a mechanism to let be able to forgive herself of anxious thoughts, difficulty encountering although noted some progress she was able to use logic when doing the MRI, also able to do deep breathing.  Noted this is positive as she can say to herself I got through this so I can get through other things.  Therapist said in the situation she has to lower the stakes as well that things do not necessarily lead to worst case scenario.  Worked on one of the core issues for anxiety loss of an ex-boyfriend.  Worked through patient's guilt  feelings recognizing she does not have control of a lot of what happens and also she making the best choices she could at the time so can let go and forgive herself.  Thus managing her Mnire's disease may help with anxiety as dizziness can feed into anxiety so exploring medications like diuretics seeing them as helpful for her.  Therapist provided active listening open questions supportive interventions.  Noted focus on wife also helpful for patient's anxiety getting out of her own thoughts to focus on her. Suicidal/Homicidal: No  Plan: Return again in 2 weeks.2.  Look at mind bully, restoration team looking at prescription from page 19, lysed anxiety disorder look at children's book, self talk anxiety book, anxiety [facility], panic handout  Diagnosis: Axis I: anic with agoraphobia, generalized anxiety disorder     Axis II: No diagnosis    Cordella Register, LCSW 12/04/2020

## 2020-12-05 ENCOUNTER — Telehealth (INDEPENDENT_AMBULATORY_CARE_PROVIDER_SITE_OTHER): Payer: BC Managed Care – PPO | Admitting: Psychiatry

## 2020-12-05 ENCOUNTER — Encounter (HOSPITAL_COMMUNITY): Payer: Self-pay | Admitting: Psychiatry

## 2020-12-05 DIAGNOSIS — F4001 Agoraphobia with panic disorder: Secondary | ICD-10-CM | POA: Diagnosis not present

## 2020-12-05 DIAGNOSIS — F411 Generalized anxiety disorder: Secondary | ICD-10-CM | POA: Diagnosis not present

## 2020-12-05 MED ORDER — ALPRAZOLAM 0.5 MG PO TABS
0.5000 mg | ORAL_TABLET | Freq: Every day | ORAL | 0 refills | Status: DC | PRN
Start: 2020-12-05 — End: 2021-01-06

## 2020-12-05 MED ORDER — BUSPIRONE HCL 7.5 MG PO TABS
7.5000 mg | ORAL_TABLET | Freq: Every day | ORAL | 0 refills | Status: DC
Start: 2020-12-05 — End: 2021-01-02

## 2020-12-05 NOTE — Progress Notes (Signed)
[facility] Follow up visit   Patient Identification: Crystal Alexander MRN:  [id number] Date of Evaluation:  12/05/2020 Referral Source: primary care Chief Complaint: follow up anxiety, panic attacks Visit Diagnosis:    ICD-10-CM   1. Panic disorder with agoraphobia  F40.01     2. GAD (generalized anxiety disorder)  F41.1      Virtual Visit via Video Note  I connected with Crystal Alexander on [id number] at 10:30 AM EDT by a video enabled telemedicine application and verified that I am speaking with the correct person using two identifiers.  Location: Patient: home Provider: office   I discussed the limitations of evaluation and management by telemedicine and the availability of in person appointments. The patient expressed understanding and agreed to proceed.      I discussed the assessment and treatment plan with the patient. The patient was provided an opportunity to ask questions and all were answered. The patient agreed with the plan and demonstrated an understanding of the instructions.   The patient was advised to call back or seek an in-person evaluation if the symptoms worsen or if the condition fails to improve as anticipated.  I provided 15 minutes of non-face-to-face time during this encounter.        History of Present Illness: Patient is a 40 years old married Caucasian female she works as a Geophysicist/field seismologist.  Her parents live in the same street she takes care of her mom who has parkinsonism.  Referred initially  by primary care physician for establish care for anxiety and panic attacks   Her wife has been diagnosed with intracranial hypertension she is going to have an appointment with her neurologist or neurosurgeon this has added anxiety although she was getting better on Xanax 0.25 mg she still has anxiety when driving and is also being evaluated for autoimmune disease and Mnire's disease she did get an MRI done with the help of Xanax  She felt she was getting better  but her wife sickness is added to the stress as she cannot drive either  Therapy with [facility] is helping  Patient is trying to go back to do massage therapy but as of now she is trying to be supportive to her wife and that has added anxiety    Aggravating factors; pandemic, mom has parkinsonism.  Medical comorbidity Modifying factors; wife    Past Psychiatric History: anxiety, panic attacks  Previous Psychotropic Medications: Yes   Substance Abuse History in the last 12 months:  No.  Consequences of Substance Abuse: NA  Past Medical History:  Past Medical History:  Diagnosis Date   Abnormal Pap smear of cervix    3/99 cin2 &3   Asthma    Fibroadenoma of breast     breast biopsy x 2   HPV (human papilloma virus) infection    Meniere's disease     Past Surgical History:  Procedure Laterality Date   CERVICAL BIOPSY  W/ LOOP ELECTRODE EXCISION  1999   negative margins   LEEP      Family Psychiatric History: mom: anxiety  Family History:  Family History  Problem Relation Age of Onset   Hypertension Mother    Parkinson's disease Mother    Diabetes Father    Hypertension Paternal Grandmother     Social History:   Social History   Socioeconomic History   Marital status: Married    Spouse name: Not on file   Number of children: Not on file   Years of education: Not  on file   Highest education level: Not on file  Occupational History   Not on file  Tobacco Use   Smoking status: Former    Types: Cigarettes   Smokeless tobacco: Never  Substance and Sexual Activity   Alcohol use: Yes    Alcohol/week: 20.0 standard drinks    Types: 20 Cans of beer per week    Comment: in a month   Drug use: No   Sexual activity: Yes    Partners: Female    Birth control/protection: None    Comment: Female partner  Other Topics Concern   Not on file  Social History Narrative   Not on file   Social Determinants of Health   Financial Resource Strain: Not on file  Food  Insecurity: Not on file  Transportation Needs: Not on file  Physical Activity: Not on file  Stress: Not on file  Social Connections: Not on file     Allergies:  No Known Allergies  Metabolic Disorder Labs: Lab Results  Component Value Date   HGBA1C 5.3 12/26/2018   MPG 108 08/21/2015   Lab Results  Component Value Date   PROLACTIN 13.1 08/07/2015   Lab Results  Component Value Date   CHOL 225 (H) 12/26/2018   TRIG 223 (H) 12/26/2018   HDL 43 12/26/2018   CHOLHDL 5.2 (H) 12/26/2018   VLDL 28 08/07/2015   LDLCALC 142 (H) 12/26/2018   LDLCALC 153 (H) 08/07/2015   Lab Results  Component Value Date   TSH 3.240 12/26/2018    Therapeutic Level Labs: No results found for: LITHIUM No results found for: CBMZ No results found for: VALPROATE  Current Medications: Current Outpatient Medications  Medication Sig Dispense Refill   ALPRAZolam (XANAX) 0.5 MG tablet Take 1 tablet (0.5 mg total) by mouth daily as needed for anxiety. 30 tablet 0   busPIRone (BUSPAR) 7.5 MG tablet Take 1 tablet (7.5 mg total) by mouth daily. 30 tablet 0   DULoxetine (CYMBALTA) 20 MG capsule TAKE 1 CAPSULE BY MOUTH EVERY DAY 90 capsule 1   fluticasone (FLOVENT HFA) 110 MCG/ACT inhaler Inhale 1 puff into the lungs daily.     metFORMIN (GLUCOPHAGE) 1000 MG tablet Take 1,000 mg by mouth 2 (two) times daily.     triamterene-hydrochlorothiazide (DYAZIDE) 37.5-25 MG capsule Take 1 each (1 capsule total) by mouth daily. 30 capsule 6   No current facility-administered medications for this visit.      Psychiatric Specialty Exam: Review of Systems  Cardiovascular:  Negative for chest pain.  Psychiatric/Behavioral:  Negative for agitation and self-injury.    There were no vitals taken for this visit.There is no height or weight on file to calculate BMI.  General Appearance: Casual  Eye Contact:  Fair  Speech:  Clear and Coherent  Volume:  Normal  Mood: Somewhat anxious  Affect:  Full Range  Thought  Process:  Goal Directed  Orientation:  Full (Time, Place, and Person)  Thought Content:  Rumination  Suicidal Thoughts:  No  Homicidal Thoughts:  No  Memory:  Immediate;   Fair Recent;   Fair  Judgement:  Fair  Insight:  Fair  Psychomotor Activity:  Normal  Concentration:  Concentration: Fair and Attention Span: Fair  Recall:  Good  Fund of Knowledge:Good  Language: Good  Akathisia:  No  Handed:   AIMS (if indicated):  not done  Assets:  Communication Skills Desire for Improvement Financial Resources/Insurance Social Support  ADL's:  Intact  Cognition: WNL  Sleep:  variable   Screenings: PHQ2-9    Flowsheet Row Counselor from 08/21/2020 in [facility] Video Visit from 07/12/2020 in [facility]  PHQ-2 Total Score 2 0  PHQ-9 Total Score 7 --      Flowsheet Row Video Visit from 12/05/2020 in [facility] Video Visit from 10/14/2020 in [facility] Counselor from 08/21/2020 in [facility] No Risk No Risk No Risk       Assessment and Plan: as follows Prior documentation reviewed Panic disorder with agoraphobia; she was getting better with Xanax 0.25 mg but added stress because of her wife's sickness has added anxiety she wants to increase her Xanax we will increase to 2.5 mg other medication including SSRI was not able to help  generalized anxiety disorder; gets anxious see stress level above continue Xanax and also BuSpar she takes it at night it helps her sleep Continue therapy follow-up in 2 months  Merian Capron, MD 9/1/202210:43 AM

## 2020-12-07 ENCOUNTER — Other Ambulatory Visit: Payer: BC Managed Care – PPO

## 2020-12-17 ENCOUNTER — Ambulatory Visit (HOSPITAL_COMMUNITY): Payer: BC Managed Care – PPO | Admitting: Licensed Clinical Social Worker

## 2020-12-31 ENCOUNTER — Ambulatory Visit (HOSPITAL_COMMUNITY): Payer: BC Managed Care – PPO | Admitting: Licensed Clinical Social Worker

## 2021-01-02 ENCOUNTER — Other Ambulatory Visit (HOSPITAL_COMMUNITY): Payer: Self-pay | Admitting: Psychiatry

## 2021-01-04 ENCOUNTER — Other Ambulatory Visit (HOSPITAL_COMMUNITY): Payer: Self-pay | Admitting: Psychiatry

## 2021-01-21 ENCOUNTER — Ambulatory Visit (INDEPENDENT_AMBULATORY_CARE_PROVIDER_SITE_OTHER): Payer: BC Managed Care – PPO | Admitting: Licensed Clinical Social Worker

## 2021-01-21 DIAGNOSIS — F411 Generalized anxiety disorder: Secondary | ICD-10-CM | POA: Diagnosis not present

## 2021-01-21 DIAGNOSIS — F4001 Agoraphobia with panic disorder: Secondary | ICD-10-CM | POA: Diagnosis not present

## 2021-01-21 NOTE — Progress Notes (Signed)
Virtual Visit via Video Note  I connected with Crystal Alexander on 32/20/25 at 11:00 AM EDT by a video enabled telemedicine application and verified that I am speaking with the correct person using two identifiers.  Location: Patient: home Provider: office   I discussed the limitations of evaluation and management by telemedicine and the availability of in person appointments. The patient expressed understanding and agreed to proceed.   I discussed the assessment and treatment plan with the patient. The patient was provided an opportunity to ask questions and all were answered. The patient agreed with the plan and demonstrated an understanding of the instructions.   The patient was advised to call back or seek an in-person evaluation if the symptoms worsen or if the condition fails to improve as anticipated.  I provided 56 minutes of non-face-to-face time during this encounter.  THERAPIST PROGRESS NOTE  Session Time: 11:00 AM to 11:56 AM  Participation Level: Active  Behavioral Response: CasualAlertAnxious  Type of Therapy: Individual Therapy  Treatment Goals addressed:  Patient wants to feel at peace and feel normal again, reduce anxiety, panic, coping Interventions: CBT, Solution Focused, Strength-based, Supportive, Reframing, and Other: coping  Summary: Crystal Alexander is a 40 y.o. female who presents with wife had brain surgery. 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 Had stints put in her brain. It was scary but helped her in the sense in that scenario had to be strong for her. Thinks couldn't get through without Xanax. Stayed the night at the hospital and hadn't left the house before. Forced to do it, push she needed to get out of comfort zone although wishes her wife wasn't going through this. Patient still has health anxiety, patient still not at ease with things wife is on blood thinner.  Therapist guided patient what was reasonable to expect to help  patient lower anxiety related to this. Patient responds that wife is healthy worries if she has to go through that again. It is a possibility. It is stressful. It has helped her eyesight. She is having incredible headaches. Reflected on what started the anxiety dizziness and stomach bug had the anxiety looming and then found iron deficient. Dizziness could be iron deficient symptoms from illness could have brought anxiety out.  Both patient and therapist recognized anxiety causes dizziness separately and to work on strategies for this as part of strategy.  Patient relates she feels like in a little bit of better place and feels solely because of the medicine. Mostly still at home. Still dizziness when drive makes her feel not confident in driving. Has Meniere's dieretics not helping. Meditation and deep breathing helping. A little more time to focus in more on her but at the same hard time working on herself because want to be there for wife and focus on her. Feel somewhat less anxious. Sister drove her to [facility] (very out of comfort zone), stayed over night in hospital no panic at hospital. Therapist guided patient in accessing realistically risk of wife to help her with her panic. Wife has stint headache. Has been through the surgery. That was the very scary part and through the worst. Patient says to herself "Let go and let God." Laughs at phrase but helps. With Xanax helps when she has a funny thought. Heart rate up and put in full blown panic. Heart racing belly breath walk up stairs able to remind herself has  asthma and 40. Therapist noted the source of it is her  thought is heart attack and COPD and noting this thought she may be able to call it out better and challenge it. Patient realizes internet is not for her feeds her anxiety. Not check her vitals as much so doesn't feed it. Wants to be healthy but tricky with anxiety. Finds the medicine and rallying for wife helped. Does identify that medicine assisted her  but still doing coping, breathing, mind in a different place. Mind says emergency take deep belly breath and saying fine and positive self-talk get off the ledge of anxiety. Does Headspace another app Balance. Reviewed plan for exposure. Push driving go further scared to be on the big road. Ask sister if she could help her. Trapped in bedroom not warm environment at home has left her more in bedroom. Wants to push and be free but circumstances not allowing. Reviewed plan of going to a smaller store passenger with sister helping. Get her out locally go to [place], [place], going to streets like [employer], Warden/ranger. Patient reviewed connecting with her physical self helps get her away from her emotional brain. Start with them safe people not embarrassed if have panic. Worked in session on rationally refuting thoughts that patient will believe. She relates something that impacts her is telling herself she is wasting time, wasting life. Tell herself how much time wasting, tearful in session and says has to live. Recognizes before able to live with risk and says not that way now. Living more with self-preservation maybe getting older.   Therapist reviewed symptoms, facilitated expression of thoughts and feelings, utilizing this as a treatment intervention to help patient process through feelings to help cope with feelings.  Work with treatment strategies around anxiety including insight of limited ability to control everything, letting go of that and trying to control something that is impossible, not trying to do the impossible and losing time in her life because of that.  Prioritizing more important to live her life than do what is impossible controlled he had uncontrollable and at the same time realizing risk is relatively low.  Work with patient on rationally refuting thoughts to develop habits that will become stronger refuting irrational and unhelpful thoughts.  Utilize strength-based and noticing ways patient  is doing it and to build on it.  Provided education relating when we react with anxiety as her emotional brain sore deep breathing is helpful as it helps rational brain take over and assess things more accurately.  Urged her with continuing meditation.  Guided patient on insight that anxiety brain, fear is often not telling us the truth like a bully using her logical brain helps challenge the anxiety.  Noted with patient's interpretations challenging herself that they are unrealistic and replaced with what is more realistic.  Also noted self soothing as helpful saying things like it will be okay, I will be all right.  Work with patient on exposure plan with neck step being sister driving around places to get her comfortable with larger roads.  Therapist provided active listening, open questions, supportive interventions.      Suicidal/Homicidal: No  Plan: 1.plan to see regularly and patient will call to set up appointments. 2.  Patient continue and developing healthy habits along with medication of rationally refuting unhelpful and unrealistic thoughts continue with exposure plan.  3.  Look at mindful bully, restoration team from page 41, generalized anxiety book look at children's book, look at anxiety book chapter on self talk look at handouts from [facility] on anxiety,  health anxiety, Internet handout on panic  Diagnosis: Axis I: Panic with agoraphobia, generalized anxiety disorder    Axis II: No diagnosis    Cordella Register, LCSW 01/21/2021

## 2021-02-04 ENCOUNTER — Other Ambulatory Visit (HOSPITAL_COMMUNITY): Payer: Self-pay | Admitting: Psychiatry

## 2021-02-07 ENCOUNTER — Telehealth (INDEPENDENT_AMBULATORY_CARE_PROVIDER_SITE_OTHER): Payer: BC Managed Care – PPO | Admitting: Psychiatry

## 2021-02-07 ENCOUNTER — Encounter (HOSPITAL_COMMUNITY): Payer: Self-pay | Admitting: Psychiatry

## 2021-02-07 DIAGNOSIS — F4001 Agoraphobia with panic disorder: Secondary | ICD-10-CM

## 2021-02-07 DIAGNOSIS — F411 Generalized anxiety disorder: Secondary | ICD-10-CM | POA: Diagnosis not present

## 2021-02-07 MED ORDER — BUSPIRONE HCL 7.5 MG PO TABS
7.5000 mg | ORAL_TABLET | Freq: Every day | ORAL | 1 refills | Status: DC
Start: 2021-02-07 — End: 2021-04-08

## 2021-02-07 NOTE — Progress Notes (Signed)
[facility] Follow up visit   Patient Identification: Crystal Alexander MRN:  [id number] Date of Evaluation:  02/07/2021 Referral Source: primary care Chief Complaint: follow up anxiety, panic attacks Visit Diagnosis:    ICD-10-CM   1. Panic disorder with agoraphobia  F40.01     2. GAD (generalized anxiety disorder)  F41.1      Virtual Visit via Video Note  I connected with Crystal Alexander on 72/53/66 at 11:00 AM EDT by a video enabled telemedicine application and verified that I am speaking with the correct person using two identifiers.  Location: Patient: home Provider: home office   I discussed the limitations of evaluation and management by telemedicine and the availability of in person appointments. The patient expressed understanding and agreed to proceed.     I discussed the assessment and treatment plan with the patient. The patient was provided an opportunity to ask questions and all were answered. The patient agreed with the plan and demonstrated an understanding of the instructions.   The patient was advised to call back or seek an in-person evaluation if the symptoms worsen or if the condition fails to improve as anticipated.  I provided 12 minutes of non-face-to-face time during this encounter.     History of Present Illness: Patient is a 40 years old married Caucasian female she works as a Geophysicist/field seismologist.  Her parents live in the same street she takes care of her mom who has parkinsonism.  Referred initially  by primary care physician for establish care for anxiety and panic attacks   Her wife has been diagnosed with intracranial hypertension , was having headaches, now gone thru stunt surgery, doing better Patient has been helping her out and had driven her as well. Xanax 0.5mg  divided dose is helping along with buspar Therapy with Crystal [facility] do massage as quiteness brings her thoughts so she is looking for customer service job from home   Aggravating factors;  pandemic, mom has parkinsonism.  Medical comorbidity Modifying factors; wife    Past Psychiatric History: anxiety, panic attacks  Previous Psychotropic Medications: Yes   Substance Abuse History in the last 12 months:  No.  Consequences of Substance Abuse: NA  Past Medical History:  Past Medical History:  Diagnosis Date   Abnormal Pap smear of cervix    3/99 cin2 &3   Asthma    Fibroadenoma of breast     breast biopsy x 2   HPV (human papilloma virus) infection    Meniere's disease     Past Surgical History:  Procedure Laterality Date   CERVICAL BIOPSY  W/ LOOP ELECTRODE EXCISION  1999   negative margins   LEEP      Family Psychiatric History: mom: anxiety  Family History:  Family History  Problem Relation Age of Onset   Hypertension Mother    Parkinson's disease Mother    Diabetes Father    Hypertension Paternal Grandmother     Social History:   Social History   Socioeconomic History   Marital status: Married    Spouse name: Not on file   Number of children: Not on file   Years of education: Not on file   Highest education level: Not on file  Occupational History   Not on file  Tobacco Use   Smoking status: Former    Types: Cigarettes   Smokeless tobacco: Never  Substance and Sexual Activity   Alcohol use: Yes    Alcohol/week: 20.0 standard drinks    Types: 20 Cans  of beer per week    Comment: in a month   Drug use: No   Sexual activity: Yes    Partners: Female    Birth control/protection: None    Comment: Female partner  Other Topics Concern   Not on file  Social History Narrative   Not on file   Social Determinants of Health   Financial Resource Strain: Not on file  Food Insecurity: Not on file  Transportation Needs: Not on file  Physical Activity: Not on file  Stress: Not on file  Social Connections: Not on file     Allergies:  No Known Allergies  Metabolic Disorder Labs: Lab Results  Component Value Date   HGBA1C 5.3  12/26/2018   MPG 108 08/21/2015   Lab Results  Component Value Date   PROLACTIN 13.1 08/07/2015   Lab Results  Component Value Date   CHOL 225 (H) 12/26/2018   TRIG 223 (H) 12/26/2018   HDL 43 12/26/2018   CHOLHDL 5.2 (H) 12/26/2018   VLDL 28 08/07/2015   LDLCALC 142 (H) 12/26/2018   LDLCALC 153 (H) 08/07/2015   Lab Results  Component Value Date   TSH 3.240 12/26/2018    Therapeutic Level Labs: No results found for: LITHIUM No results found for: CBMZ No results found for: VALPROATE  Current Medications: Current Outpatient Medications  Medication Sig Dispense Refill   ALPRAZolam (XANAX) 0.5 MG tablet TAKE 1 TABLET BY MOUTH EVERY DAY AS NEEDED FOR ANXIETY 30 tablet 0   busPIRone (BUSPAR) 7.5 MG tablet Take 1 tablet (7.5 mg total) by mouth daily. 30 tablet 1   fluticasone (FLOVENT HFA) 110 MCG/ACT inhaler Inhale 1 puff into the lungs daily.     metFORMIN (GLUCOPHAGE) 1000 MG tablet Take 1,000 mg by mouth 2 (two) times daily.     triamterene-hydrochlorothiazide (DYAZIDE) 37.5-25 MG capsule Take 1 each (1 capsule total) by mouth daily. 30 capsule 6   No current facility-administered medications for this visit.      Psychiatric Specialty Exam: Review of Systems  Cardiovascular:  Negative for chest pain.  Psychiatric/Behavioral:  Negative for agitation and self-injury.    There were no vitals taken for this visit.There is no height or weight on file to calculate BMI.  General Appearance: Casual  Eye Contact:  Fair  Speech:  Clear and Coherent  Volume:  Normal  Mood: better  Affect:  Full Range  Thought Process:  Goal Directed  Orientation:  Full (Time, Place, and Person)  Thought Content:  Rumination  Suicidal Thoughts:  No  Homicidal Thoughts:  No  Memory:  Immediate;   Fair Recent;   Fair  Judgement:  Fair  Insight:  Fair  Psychomotor Activity:  Normal  Concentration:  Concentration: Fair and Attention Span: Fair  Recall:  Good  Fund of Knowledge:Good   Language: Good  Akathisia:  No  Handed:   AIMS (if indicated):  not done  Assets:  Communication Skills Desire for Improvement Financial Resources/Insurance Social Support  ADL's:  Intact  Cognition: WNL  Sleep:   variable   Screenings: Camera operator Row Counselor from 08/21/2020 in [facility] Video Visit from 07/12/2020 in [facility]  PHQ-2 Total Score 2 0  PHQ-9 Total Score 7 --      Flowsheet Row Video Visit from 02/07/2021 in [facility] Video Visit from 12/05/2020 in [facility] Video Visit from 10/14/2020 in  [facility]  C-SSRS RISK CATEGORY No Risk No Risk No Risk       Assessment and Plan: as follows  Prior documentation reviewed  Panic disorder with agoraphobia; improved with xanax 0.5 qd divided dose and buspar Continue   generalized anxiety disorder; manageable with above meds and therapy  Fu 81m renewed meds which were due Merian Capron, MD 11/4/202211:10 AM

## 2021-02-11 ENCOUNTER — Ambulatory Visit (INDEPENDENT_AMBULATORY_CARE_PROVIDER_SITE_OTHER): Payer: BC Managed Care – PPO | Admitting: Licensed Clinical Social Worker

## 2021-02-11 DIAGNOSIS — F411 Generalized anxiety disorder: Secondary | ICD-10-CM | POA: Diagnosis not present

## 2021-02-11 DIAGNOSIS — F4001 Agoraphobia with panic disorder: Secondary | ICD-10-CM

## 2021-02-11 NOTE — Progress Notes (Signed)
Virtual Visit via Video Note  I connected with Crystal Alexander on 40/97/35 at  2:00 PM EST by a video enabled telemedicine application and verified that I am speaking with the correct person using two identifiers.  Location: Patient: home Provider: office   I discussed the limitations of evaluation and management by telemedicine and the availability of in person appointments. The patient expressed understanding and agreed to proceed.   I discussed the assessment and treatment plan with the patient. The patient was provided an opportunity to ask questions and all were answered. The patient agreed with the plan and demonstrated an understanding of the instructions.   The patient was advised to call back or seek an in-person evaluation if the symptoms worsen or if the condition fails to improve as anticipated.  I provided 53 minutes of non-face-to-face time during this encounter.  THERAPIST PROGRESS NOTE  Session Time: 2:00 PM to 2:53 PM  Participation Level: Active  Behavioral Response: CasualAlertAnxious and improve symptoms thymic in session  Type of Therapy: Individual Therapy  Treatment Goals addressed:  Patient wants to feel at peace and feel normal again, reduce anxiety, panic, coping Interventions: Solution Focused, Strength-based, Supportive, and Other: coping  Summary: Crystal Alexander is a 40 y.o. female who presents with a little bit better able to get out of the house wife driving [place] where felt positive and have to do something. Money tight have to do anything. Not thrilled not working for herself but can quit job if she wants to. Good option for her too. Have panic bubbling up but no panic attacks for a couple of weeks, that is the space she needs to forget what it was like. When it is daily impacts body and mind. Medication for sure is major help. Need to get out of fight and flight cycle nervous system shot. 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 Thought going back to work and fear bubble out and realized not joyful anymore. Silence. 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 Had about 20 interviews has a health care background doesn't love it not her passion but a job and get her of head. Feel good talking to people in this space will have contact with people help build confidence in speaking with people in public just feel better talking to people. Weird bubble home in her room. Sees her mom and even then panic why when in comfort zone. Home environment stressor with mother-in-law leaving next week. Girlfriend doing a lot better all that behind her improved her mood.  Life has felt stressful and now feels more like up. Before out of nowhere think having a heart attack, can't breath, something choking her relieved because it beat her down. Helps when people empathize dad doesn't understand. Helps to know that she has medication part of coping when she goes places.  Wife feels better definitely thing that helpful. With medication feels like a normal human being. Into birds, talked about enjoying the water in [place], [place] languages with no lingo things to look forward to them we enjoy.  Patient also is a Radio broadcast assistant.   Therapist reviewed symptoms, facilitated expression of thoughts and feelings and assessed and session patient doing better and also  per her report a little better things like having had a panic attack in 2 weeks relief of symptoms does help her make further progress.  Her girlfriend getting better is helpful.  Therapist gave patient praise with good idea customer service at home recognizing to which she can for now will help things build in a positive momentum she will feel good about making  money.  She will have more confidence speaking in a public arena but also will help her connect with others which is helpful as she is very isolated now.  Therapist shared information from [facility] talk encourage patient to look at it noting therapy helpful in building her toolkit that she needs to do the work in her cognition to feel it in her heart feel relief of symptoms reviewing not 1 size fits all have to find what works for her.  Reviewed some of the suggestions in the [facility] talk of managing by doing things like checking in with her intrusive thoughts asking herself is this a factor negative feeling checking in with her body and her reality checking in breathing exercises meditation.  In session also focused more on learning more about each other helping with mood reviewing things that we enjoy helps this in reviewing it but also be positive and looking forward to that.  Therapist provided active listening open questions, supportive interventions Suicidal/Homicidal: No  Plan: Return again in 3 weeks.2.  Look at your tango for panic, reviewed to talk with patient recommended restoration team, anxiety book chapter on self talk, [facility] anxiety, health anxiety, generalized anxiety disorder and child's book helpful to bring out points  Diagnosis: Axis I: Panic with agoraphobia, generalized anxiety disorder    Axis II: No diagnosis    Cordella Register, LCSW 02/11/2021

## 2021-03-03 ENCOUNTER — Ambulatory Visit (HOSPITAL_COMMUNITY): Payer: BC Managed Care – PPO | Admitting: Licensed Clinical Social Worker

## 2021-03-05 ENCOUNTER — Ambulatory Visit: Payer: 59 | Admitting: Obstetrics and Gynecology

## 2021-03-21 ENCOUNTER — Other Ambulatory Visit (HOSPITAL_COMMUNITY): Payer: Self-pay | Admitting: Psychiatry

## 2021-03-24 ENCOUNTER — Ambulatory Visit (INDEPENDENT_AMBULATORY_CARE_PROVIDER_SITE_OTHER): Payer: BC Managed Care – PPO | Admitting: Licensed Clinical Social Worker

## 2021-03-24 DIAGNOSIS — F411 Generalized anxiety disorder: Secondary | ICD-10-CM

## 2021-03-24 DIAGNOSIS — F4001 Agoraphobia with panic disorder: Secondary | ICD-10-CM | POA: Diagnosis not present

## 2021-03-24 NOTE — Progress Notes (Signed)
Virtual Visit via Video Note  I connected with Nitika Pinkhasov on 10/93/23 at  1:00 PM EST by a video enabled telemedicine application and verified that I am speaking with the correct person using two identifiers.  Location: Patient: home Provider: office   I discussed the limitations of evaluation and management by telemedicine and the availability of in person appointments. The patient expressed understanding and agreed to proceed.   I discussed the assessment and treatment plan with the patient. The patient was provided an opportunity to ask questions and all were answered. The patient agreed with the plan and demonstrated an understanding of the instructions.   The patient was advised to call back or seek an in-person evaluation if the symptoms worsen or if the condition fails to improve as anticipated.  I provided 54 minutes of non-face-to-face time during this encounter.   THERAPIST PROGRESS NOTE  Session Time: 1:00 PM to 1:54 PM  Participation Level: Active  Behavioral Response: CasualAlertEuthymic patient reports continued anxiety but anxiety better and euthymic in session  Type of Therapy: Individual Therapy  Treatment Goals addressed:  patient wants to feel at peace and feel normal again, reduce anxiety, panic, coping, work on grief, note impacts her anxiety Interventions: CBT, Solution Focused, Strength-based, Supportive, and Other: grief  Summary: Hilari Ehresman is a 40 y.o. 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  Not her dream job noted job security. Anxiety is a lot better, still there not driving. Dizziness still there and not been handled made her have severe anxiety with driving. Drives to mom's down the road. Shopping across the street can stay in block range. Hasn't pushed herself fccused on her wife, Urban Gibson, medical issues.   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 One thing after the other. Patient says that her having brain surgery twice has made the more prepared to manage ongoing issues although worried worried both try not to look up patient knows it contributes to her anxiety. Knows as far as this type of cancer more treatable. Thinks helped with anxiety focus on her, put in perspective. Through things make her stronger, a big part for patient also is Xanax really helps. Would like to get off but put that off and says until moment when have  confidence to drive consider getting off Xanax, this point dealing with her wife's health stuff, her ear issues on back burner. Full focus on her and also doesn't like going to doctor. One step at a time to pull out of panic attack and anxiety. When first saw therapist barely go anywhere goes into store and goes places with wife.  Sometimes on other side of town still uneasiness panic bubbles takes Xanax doesn't want to experience another panic attack doesn't want to get caught in that cycle and what that feels like   Not into what she normally likes to do at this point to go out and do things like go to 2nd hand stores. Loves to travel and doesn't do right now. Not driving is nuts to her.  Talked about anxiety how it started COVID shot inflammatory things that it  scared her, loss of ex-boyfriend. Also massage client who has severe panic disorder talked about it constantly. Affected patient didn't have blockers up. Ear issues flares up. Dizziness thinks anxiety worse. Also iron deficiency. When first started therapy very fatigued. Talked about grief how would they want to live her life. Reading a book on grief.  Helps to read about this person's feelings, her ephiphanies  feelings and ephiphanies can apply those ephanieis. Talked about anxiety books that helped her. She talks about floating feel and float to get past it. Anxiety guy Podcast recommended this author. Have time between panic helps you forget what feels like, says it is important not to put nervous system and fight flight what happens if you have another panic and can set your backwards.    Therapist reviewed symptoms, facilitated expression of thoughts and feeding and completed an updated treatment plan.  Continue to work with patient on concept so easier to practice of recognizing when worries saying something that is not as significant as you would think, second-guessing worry and saying what you really think to get rational mind involved.  Therapist provided positive feedback to patient's attitude about looking for work excepting rejection and feeling as many applications out if she could therapist feels that very effective way to look for work not allowing your ego to be too much involved to hold your back.  Work with insights patient has found from reading about panic that she describes it is like floating through it therapist applied her understanding not to fight panic and to let it flow through her as well is helpful when symptoms have escalated too far.  Noted grade playing a significant factor therapist said bringing that to conscious is getting help her work through it, patient's bibliotherapy is helpful also talked about how or not 1 1 office not to live not happy life and keep that in mind.  Also discussed process of grief patient already experiencing that she finds she can enjoy things even though experiencing grief which shows some movement and grieving process.  Therapist provided active listening open questions, supportive interventions Suicidal/Homicidal: No  Plan: Return again in 2 weeks.2.  Look at anxiety book at self talk and your tango on panic, work on grief with patient  Diagnosis: Axis  I: Panic with agoraphobia, generalized anxiety disorder    Axis II: No diagnosis    Cordella Register, LCSW 03/24/2021

## 2021-04-08 ENCOUNTER — Telehealth (INDEPENDENT_AMBULATORY_CARE_PROVIDER_SITE_OTHER): Payer: BC Managed Care – PPO | Admitting: Psychiatry

## 2021-04-08 ENCOUNTER — Encounter (HOSPITAL_COMMUNITY): Payer: Self-pay | Admitting: Psychiatry

## 2021-04-08 DIAGNOSIS — F4001 Agoraphobia with panic disorder: Secondary | ICD-10-CM | POA: Diagnosis not present

## 2021-04-08 DIAGNOSIS — F411 Generalized anxiety disorder: Secondary | ICD-10-CM

## 2021-04-08 MED ORDER — BUSPIRONE HCL 7.5 MG PO TABS: 7.5000 mg | ORAL_TABLET | Freq: Every day | ORAL | 1 refills | Status: DC

## 2021-04-08 NOTE — Progress Notes (Signed)
[facility] Follow up visit   Patient Identification: Crystal Alexander MRN:  [id number] Date of Evaluation:  04/08/2021 Referral Source: primary care Chief Complaint: follow up anxiety, panic attacks Visit Diagnosis:    ICD-10-CM   1. Panic disorder with agoraphobia  F40.01     2. GAD (generalized anxiety disorder)  F41.1      Virtual Visit via Video Note  I connected with Alainna Geffert on 41/66/06 at 10:15 AM EST by a video enabled telemedicine application and verified that I am speaking with the correct person using two identifiers.  Location: Patient: home Provider: office   I discussed the limitations of evaluation and management by telemedicine and the availability of in person appointments. The patient expressed understanding and agreed to proceed.      I discussed the assessment and treatment plan with the patient. The patient was provided an opportunity to ask questions and all were answered. The patient agreed with the plan and demonstrated an understanding of the instructions.   The patient was advised to call back or seek an in-person evaluation if the symptoms worsen or if the condition fails to improve as anticipated.  I provided 15 minutes of non-face-to-face time during this encounter.    History of Present Illness: Patient is a 41  years old married Caucasian female she works as a Geophysicist/field seismologist.  Her parents live in the same street she takes care of her mom who has parkinsonism.  Referred initially  by primary care physician for establish care for anxiety and panic attacks   Her wife has been diagnosed with intracranial hypertension , that effects her , she worries of her health  Was doing fair last week Has got a remote job looking forward to that Xanax helps panic and thearpy Ovaerll takes buspar otherwise regularly for anxiety Has gone out shopping and outside more Cant do massage due to quitness   Aggravating factors; pandemic, mom has parkinsonism.   Medical comorbidity Modifying factors; wife    Past Psychiatric History: anxiety, panic attacks  Previous Psychotropic Medications: Yes   Substance Abuse History in the last 12 months:  No.  Consequences of Substance Abuse: NA  Past Medical History:  Past Medical History:  Diagnosis Date   Abnormal Pap smear of cervix    3/99 cin2 &3   Asthma    Fibroadenoma of breast     breast biopsy x 2   HPV (human papilloma virus) infection    Meniere's disease     Past Surgical History:  Procedure Laterality Date   CERVICAL BIOPSY  W/ LOOP ELECTRODE EXCISION  1999   negative margins   LEEP      Family Psychiatric History: mom: anxiety  Family History:  Family History  Problem Relation Age of Onset   Hypertension Mother    Parkinson's disease Mother    Diabetes Father    Hypertension Paternal Grandmother     Social History:   Social History   Socioeconomic History   Marital status: Married    Spouse name: Not on file   Number of children: Not on file   Years of education: Not on file   Highest education level: Not on file  Occupational History   Not on file  Tobacco Use   Smoking status: Former    Types: Cigarettes   Smokeless tobacco: Never  Substance and Sexual Activity   Alcohol use: Yes    Alcohol/week: 20.0 standard drinks    Types: 20 Cans of beer per week  Comment: in a month   Drug use: No   Sexual activity: Yes    Partners: Female    Birth control/protection: None    Comment: Female partner  Other Topics Concern   Not on file  Social History Narrative   Not on file   Social Determinants of Health   Financial Resource Strain: Not on file  Food Insecurity: Not on file  Transportation Needs: Not on file  Physical Activity: Not on file  Stress: Not on file  Social Connections: Not on file     Allergies:  No Known Allergies  Metabolic Disorder Labs: Lab Results  Component Value Date   HGBA1C 5.3 12/26/2018   MPG 108 08/21/2015    Lab Results  Component Value Date   PROLACTIN 13.1 08/07/2015   Lab Results  Component Value Date   CHOL 225 (H) 12/26/2018   TRIG 223 (H) 12/26/2018   HDL 43 12/26/2018   CHOLHDL 5.2 (H) 12/26/2018   VLDL 28 08/07/2015   LDLCALC 142 (H) 12/26/2018   LDLCALC 153 (H) 08/07/2015   Lab Results  Component Value Date   TSH 3.240 12/26/2018    Therapeutic Level Labs: No results found for: LITHIUM No results found for: CBMZ No results found for: VALPROATE  Current Medications: Current Outpatient Medications  Medication Sig Dispense Refill   ALPRAZolam (XANAX) 0.5 MG tablet TAKE 1 TABLET BY MOUTH EVERY DAY AS NEEDED FOR ANXIETY 30 tablet 0   busPIRone (BUSPAR) 7.5 MG tablet Take 1 tablet (7.5 mg total) by mouth daily. 30 tablet 1   fluticasone (FLOVENT HFA) 110 MCG/ACT inhaler Inhale 1 puff into the lungs daily.     metFORMIN (GLUCOPHAGE) 1000 MG tablet Take 1,000 mg by mouth 2 (two) times daily.     triamterene-hydrochlorothiazide (DYAZIDE) 37.5-25 MG capsule Take 1 each (1 capsule total) by mouth daily. 30 capsule 6   No current facility-administered medications for this visit.      Psychiatric Specialty Exam: Review of Systems  Cardiovascular:  Negative for chest pain.  Neurological:  Negative for tremors.  Psychiatric/Behavioral:  Negative for agitation, hallucinations and self-injury.    There were no vitals taken for this visit.There is no height or weight on file to calculate BMI.  General Appearance: Casual  Eye Contact:  Fair  Speech:  Clear and Coherent  Volume:  Normal  Mood: fair  Affect:  Full Range  Thought Process:  Goal Directed  Orientation:  Full (Time, Place, and Person)  Thought Content:  Rumination  Suicidal Thoughts:  No  Homicidal Thoughts:  No  Memory:  Immediate;   Fair Recent;   Fair  Judgement:  Fair  Insight:  Fair  Psychomotor Activity:  Normal  Concentration:  Concentration: Fair and Attention Span: Fair  Recall:  Good  Fund of  Knowledge:Good  Language: Good  Akathisia:  No  Handed:   AIMS (if indicated):  not done  Assets:  Communication Skills Desire for Improvement Financial Resources/Insurance Social Support  ADL's:  Intact  Cognition: WNL  Sleep:   variable   Screenings: Camera operator Row Counselor from 08/21/2020 in [facility] Video Visit from 07/12/2020 in [facility]  PHQ-2 Total Score 2 0  PHQ-9 Total Score 7 --      Flowsheet Row Video Visit from 04/08/2021 in [facility] Video Visit from 02/07/2021 in [facility] Video Visit from 12/05/2020 in  [facility]  C-SSRS RISK CATEGORY No Risk No Risk No Risk       Assessment and Plan: as follows  Prior documentation reviewed   Panic disorder with agoraphobia; doing fair with meds, xanax and buspar Has gone out more , understands to work on breathing and continue thearpy   generalized anxiety disorder; manageable continue buspar Anxiety related to starting new job, provided supportive therapy  Fu 77m renewed meds which were due Merian Capron, MD 1/3/202310:23 AM

## 2021-04-10 ENCOUNTER — Ambulatory Visit (INDEPENDENT_AMBULATORY_CARE_PROVIDER_SITE_OTHER): Payer: BC Managed Care – PPO | Admitting: Licensed Clinical Social Worker

## 2021-04-10 DIAGNOSIS — F4001 Agoraphobia with panic disorder: Secondary | ICD-10-CM

## 2021-04-10 DIAGNOSIS — F411 Generalized anxiety disorder: Secondary | ICD-10-CM

## 2021-04-10 NOTE — Progress Notes (Signed)
Virtual Visit via Video Note  I connected with Katheen Luebbe on 16/60/63 at 11:00 AM EST by a video enabled telemedicine application and verified that I am speaking with the correct person using two identifiers.  Location: Patient: home Provider: home office   I discussed the limitations of evaluation and management by telemedicine and the availability of in person appointments. The patient expressed understanding and agreed to proceed.   I discussed the assessment and treatment plan with the patient. The patient was provided an opportunity to ask questions and all were answered. The patient agreed with the plan and demonstrated an understanding of the instructions.   The patient was advised to call back or seek an in-person evaluation if the symptoms worsen or if the condition fails to improve as anticipated.  I provided 53 minutes of non-face-to-face time during this encounter.  THERAPIST PROGRESS NOTE  Session Time: 11:04 PM to 11:57 PM  Participation Level: Active  Behavioral Response: CasualAlertAnxious  Type of Therapy: Individual Therapy  Treatment Goals addressed:  patient wants to feel at peace and feel normal again, reduce anxiety, panic, coping, work on grief, note impacts her anxiety Interventions: CBT, Motivational Interviewing, Play Therapy, Supportive, and Other: coping  Summary: Shaddai Goold is a 41 y.o. female who presents with stomach upset, had a headache thinks they are menstrual migraines and will have for days. Some related to headache a little bit of nerves. A little more panicky since last time, last time more positive with new job.  Wife had a CT scan want to put a stent in juggler during CT found nodules on thyroid. Last Thursday did ultra sound in has intracranial hypertension and can have something to do with kidney. Wanted to check it. 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  She will explain this to therapist that they  are a little mass. Mind goes to the worst. Panic attack which hasn't had in months anxiety feeling creeping in and hates it. Come a long way sucker punch with  health stuff, nervous system can't settle. Life hard and feels she needs to work on getting stronger where falling behind. Says to therapist hopefully will catch up.  Therapist worked on treatment interventions the patient be less hard on herself having her focus on realization she has things she has to work on.  She notes how she feels all this sucked her right back.  Things came out of blue can happen but in this case wife is young and this is so out of the blue.  Therapist also noted have to deal with setbacks patient remind herself this happens to change and this not as catastrophic. But right now looking at things as glass half empty the time. Reviewed resources she has from books and any she thinks could help. She thinks she will go back to book and start meditation.  She shared her stop doing it as she thought she was getting better was doing great and then hit with this. Nervous with job with this gloomy cloud not being able to take off work if needs to.Talked about if have to quit quit with her wife.  Therapist suggested taking things as come finding out and planning when she knows more.  Shared her feelings about quitting that she knows her self personally she tries to do a good job and her approach is to stick with it. Lucky to have an opportunity to have a remote and thinks don't mess it up.  She says she will be needing to think at new job and anxiety in back on brain. Bumps doctor says more than likely benign but hear things.  Patient talked about her own medical history at a young age had HPV at 76. At 16 had a "leap" and explained that they scraped cervix traumatizing for her. 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 Cut it out piece a piece of cervix. Having procedure when so young.  16 one step away from cancer leap painful, anything done there is a stress.  Remember Dad crying and that scared her.  Therapist noted the how this were may relate to her current health issues and patient says good insight probably does. Has had nothing since fibroid tiny not going to check.  In helping with her worry patient again said she had good results from this early experience and nothing happened. PCP is referring to wife endocrinologist. Have to wait that for needle aspiration. Now wishes mother-in-law hadn't left and wishes she was here, comforting to have a mother figure here. Anxiety revert like child and likes having somebody older than you with knowledge and keep you safe. Still not driving that is crazy for her. Drive to shopping center crosses two lane road goes to and [facility]. Try to get on busy road sometimes with dizziness takes her breath away and panic coming can feel it. Feels helpless and can't drive.  Patient became tearful describing this depends on sister and Dad.  Gust grief issues and knows she needs to put on back burner.  Comes up and has a little cry successful pushing aside can't deal with it will put her in a tailspin. Shares this is the most important person who passed away.  He had session patient said doctor suggested worrying for 10 minutes but letting it go as she can do nothing about it utilizing that approach getting back into her meditation at her resources.       Therapist reviewed symptoms, facilitated expression of thoughts and feelings as important treatment intervention as patient is dealing with significant stressors needing to work through them process things to help decrease her anxiety.  Discussed some helpful coping strategies which includes not worrying about things she cannot control, reviewed issues with her wife what they are looking at and suggested stepping back and the dressing the next thing they need to set of trying to figure it all out.  When she gets her test see what they need to do next.  Noted at the same time and  validated patient's worry and concern that mind can actually go into the future and worry.  Noted helpful suggestion for doctor giving herself 10 minutes to worry and letting it go again because these are things she has no control over.  Noted more anxiety but therapist pointed out realistic path and working on things are going to be setbacks and working through them.  Noted past medical incident in teenage years may relate to her health anxiety helpful to have insight so she can get better at leaving the past in the past and still not affect her present.  Encourage patient in getting back into meditation not only for relaxation but also building more awareness will help her with emotional regulation skills of thoughts and feelings.  Processed through job stressors and helpful attitudes toward this as well doing the best she can in lowering expectation that she will be learning something new so will be challenging at first.  Therapist provided active listening open  questions, supportive interventions.  Provided resources for patient including but a brown insight of completing the stress cycle every day and a language the body understands why meditation can be helpful or exercise patient will look at that also [facility] resource for Grief.  Suicidal/Homicidal: No  Plan: Return again in 6  weeks.  Patient will call if needs something sooner. 2.  Patient begin meditation and look at resources practice coping that is helpful including letting anxiety go when there is nothing she can do about it. 3.  Look at other resources in session as needed such as your tango panic, self talk anxiety book grief work if stressors decrease  Diagnosis: Axis I: Panic with agoraphobia, generalized anxiety disorder    Axis II: No diagnosis    Cordella Register, LCSW 04/10/2021

## 2021-04-14 ENCOUNTER — Telehealth (HOSPITAL_COMMUNITY): Payer: BC Managed Care – PPO | Admitting: Psychiatry

## 2021-04-19 ENCOUNTER — Other Ambulatory Visit (HOSPITAL_COMMUNITY): Payer: Self-pay | Admitting: Psychiatry

## 2021-04-30 ENCOUNTER — Other Ambulatory Visit (HOSPITAL_COMMUNITY): Payer: Self-pay | Admitting: Psychiatry

## 2021-05-18 ENCOUNTER — Other Ambulatory Visit (HOSPITAL_COMMUNITY): Payer: Self-pay | Admitting: Psychiatry

## 2021-05-23 ENCOUNTER — Ambulatory Visit (HOSPITAL_COMMUNITY): Payer: BC Managed Care – PPO | Admitting: Licensed Clinical Social Worker

## 2021-05-26 ENCOUNTER — Ambulatory Visit (HOSPITAL_COMMUNITY): Payer: BC Managed Care – PPO | Admitting: Licensed Clinical Social Worker

## 2021-05-27 ENCOUNTER — Ambulatory Visit (HOSPITAL_COMMUNITY): Payer: BC Managed Care – PPO | Admitting: Licensed Clinical Social Worker

## 2021-05-30 ENCOUNTER — Telehealth (HOSPITAL_COMMUNITY): Payer: BC Managed Care – PPO | Admitting: Psychiatry

## 2021-06-02 ENCOUNTER — Telehealth (HOSPITAL_COMMUNITY): Payer: BC Managed Care – PPO | Admitting: Psychiatry

## 2021-06-10 ENCOUNTER — Telehealth (HOSPITAL_COMMUNITY): Payer: BC Managed Care – PPO | Admitting: Psychiatry

## 2021-06-10 ENCOUNTER — Encounter (HOSPITAL_COMMUNITY): Payer: Self-pay | Admitting: Psychiatry

## 2021-06-10 ENCOUNTER — Telehealth (INDEPENDENT_AMBULATORY_CARE_PROVIDER_SITE_OTHER): Payer: BC Managed Care – PPO | Admitting: Psychiatry

## 2021-06-10 DIAGNOSIS — F4001 Agoraphobia with panic disorder: Secondary | ICD-10-CM

## 2021-06-10 DIAGNOSIS — F411 Generalized anxiety disorder: Secondary | ICD-10-CM

## 2021-06-10 MED ORDER — ALPRAZOLAM 0.5 MG PO TABS: ORAL_TABLET | ORAL | 0 refills | Status: DC

## 2021-06-10 NOTE — Progress Notes (Signed)
[facility] Follow up visit  ? ?Patient Identification: Crystal Alexander ?MRN:  [id number] ?Date of Evaluation:  06/10/2021 ?Referral Source: primary care ?Chief Complaint: follow up anxiety, panic attacks ?Visit Diagnosis:  ?  ICD-10-CM   ?1. Panic disorder with agoraphobia  F40.01   ?  ?2. GAD (generalized anxiety disorder)  F41.1   ?  ? ?Virtual Visit via Video Note ? ?I connected with Crystal Alexander on 33/83/29 at 10:00 AM EST by a video enabled telemedicine application and verified that I am speaking with the correct person using two identifiers. ? ?Location: ?Patient: home ?Provider: home office ?  ?I discussed the limitations of evaluation and management by telemedicine and the availability of in person appointments. The patient expressed understanding and agreed to proceed. ? ? ?  ?I discussed the assessment and treatment plan with the patient. The patient was provided an opportunity to ask questions and all were answered. The patient agreed with the plan and demonstrated an understanding of the instructions. ?  ?The patient was advised to call back or seek an in-person evaluation if the symptoms worsen or if the condition fails to improve as anticipated. ? ?I provided 15 minutes of non-face-to-face time during this encounter. ? ? ? ?History of Present Illness: Patient is a 41  years old married Caucasian female she works as a Geophysicist/field seismologist.  Her parents live in the same street she takes care of her mom who has parkinsonism.  Referred initially  by primary care physician for establish care for anxiety and panic attacks ? ? ?Her wife has been diagnosed with intracranial hypertension ,she is recovering and back to starting work ?Patient didn't tolerate the new online work job would get dizzy sitting in front of computer for hours or after ? ?Not doing massage due to panic and anxiety  ?Overall xanax helps anxiety and manages to go out even doordashing with wife ?Therapy helping ? ?Aggravating factors; pandemic, mom  has parkinsonism. Medical comorbidity ?Modifying factors; wife ? ?Severity : fluctuates but xanax and med help ? ?Past Psychiatric History: anxiety, panic attacks ? ?Previous Psychotropic Medications: Yes  ? ?Substance Abuse History in the last 12 months:  No. ? ?Consequences of Substance Abuse: ?NA ? ?Past Medical History:  ?Past Medical History:  ?Diagnosis Date  ? Abnormal Pap smear of cervix   ? 3/99 cin2 &3  ? Asthma   ? Fibroadenoma of breast   ?  breast biopsy x 2  ? HPV (human papilloma virus) infection   ? Meniere's disease   ?  ?Past Surgical History:  ?Procedure Laterality Date  ? CERVICAL BIOPSY  W/ LOOP ELECTRODE EXCISION  1999  ? negative margins  ? LEEP    ? ? ?Family Psychiatric History: mom: anxiety ? ?Family History:  ?Family History  ?Problem Relation Age of Onset  ? Hypertension Mother   ? Parkinson's disease Mother   ? Diabetes Father   ? Hypertension Paternal Grandmother   ? ? ?Social History:   ?Social History  ? ?Socioeconomic History  ? Marital status: Married  ?  Spouse name: Not on file  ? Number of children: Not on file  ? Years of education: Not on file  ? Highest education level: Not on file  ?Occupational History  ? Not on file  ?Tobacco Use  ? Smoking status: Former  ?  Types: Cigarettes  ? Smokeless tobacco: Never  ?Substance and Sexual Activity  ? Alcohol use: Yes  ?  Alcohol/week: 20.0 standard drinks  ?  Types: 20 Cans of beer per week  ?  Comment: in a month  ? Drug use: No  ? Sexual activity: Yes  ?  Partners: Female  ?  Birth control/protection: None  ?  Comment: Female partner  ?Other Topics Concern  ? Not on file  ?Social History Narrative  ? Not on file  ? ?Social Determinants of Health  ? ?Financial Resource Strain: Not on file  ?Food Insecurity: Not on file  ?Transportation Needs: Not on file  ?Physical Activity: Not on file  ?Stress: Not on file  ?Social Connections: Not on file  ? ? ? ?Allergies:  No Known Allergies ? ?Metabolic Disorder Labs: ?Lab Results  ?Component  Value Date  ? HGBA1C 5.3 12/26/2018  ? MPG 108 08/21/2015  ? ?Lab Results  ?Component Value Date  ? PROLACTIN 13.1 08/07/2015  ? ?Lab Results  ?Component Value Date  ? CHOL 225 (H) 12/26/2018  ? TRIG 223 (H) 12/26/2018  ? HDL 43 12/26/2018  ? CHOLHDL 5.2 (H) 12/26/2018  ? VLDL 28 08/07/2015  ? LDLCALC 142 (H) 12/26/2018  ? LDLCALC 153 (H) 08/07/2015  ? ?Lab Results  ?Component Value Date  ? TSH 3.240 12/26/2018  ? ? ?Therapeutic Level Labs: ?No results found for: LITHIUM ?No results found for: CBMZ ?No results found for: VALPROATE ? ?Current Medications: ?Current Outpatient Medications  ?Medication Sig Dispense Refill  ? ALPRAZolam (XANAX) 0.5 MG tablet TAKE 1 TABLET BY MOUTH EVERY DAY AS NEEDED FOR ANXIETY 30 tablet 0  ? busPIRone (BUSPAR) 7.5 MG tablet TAKE 1 TABLET BY MOUTH EVERY DAY 90 tablet 0  ? fluticasone (FLOVENT HFA) 110 MCG/ACT inhaler Inhale 1 puff into the lungs daily.    ? metFORMIN (GLUCOPHAGE) 1000 MG tablet Take 1,000 mg by mouth 2 (two) times daily.    ? triamterene-hydrochlorothiazide (DYAZIDE) 37.5-25 MG capsule Take 1 each (1 capsule total) by mouth daily. 30 capsule 6  ? ?No current facility-administered medications for this visit.  ? ? ? ? ?Psychiatric Specialty Exam: ?Review of Systems  ?Cardiovascular:  Negative for chest pain.  ?Neurological:  Negative for tremors.  ?Psychiatric/Behavioral:  Negative for agitation, hallucinations and self-injury.    ?There were no vitals taken for this visit.There is no height or weight on file to calculate BMI.  ?General Appearance: Casual  ?Eye Contact:  Fair  ?Speech:  Clear and Coherent  ?Volume:  Normal  ?Mood: fair  ?Affect:  Full Range  ?Thought Process:  Goal Directed  ?Orientation:  Full (Time, Place, and Person)  ?Thought Content:  Rumination  ?Suicidal Thoughts:  No  ?Homicidal Thoughts:  No  ?Memory:  Immediate;   Fair ?Recent;   Fair  ?Judgement:  Fair  ?Insight:  Fair  ?Psychomotor Activity:  Normal  ?Concentration:  Concentration: Fair and  Attention Span: Fair  ?Recall:  Good  ?Fund of [place]  ?Language: Good  ?Akathisia:  No  ?Handed:   ?AIMS (if indicated):  not done  ?Assets:  Communication Skills ?Desire for Improvement ?Financial Resources/Insurance ?Social Support  ?ADL's:  Intact  ?Cognition: WNL  ?Sleep:   variable  ? ?Screenings: ?PHQ2-9   ? ?Flowsheet Row Counselor from 08/21/2020 in [facility] Video Visit from 07/12/2020 in [facility]  ?PHQ-2 Total Score 2 0  ?PHQ-9 Total Score 7 --  ? ?  ? ?Flowsheet Row Video Visit from 06/10/2021 in [facility] Video Visit from 04/08/2021 in [facility] Video Visit from 02/07/2021 in [facility]  ?C-SSRS RISK CATEGORY No Risk No Risk No Risk  ? ?  ? ? ?Assessment and Plan: as follows ? ?Prior documentation reviewed ? ? ?Panic disorder with agoraphobia;somewhat more manageable with xanax and therapy ? ?Has gone out more ,did doordash with wfie ? ? ?generalized anxiety disorder; worries related to health, continue buspar, xanax ant erhapy ? ? ?Fu 44mrenewed meds which were due ?NMerian Capron MD ?3/7/202310:08 AM ?

## 2021-06-11 ENCOUNTER — Ambulatory Visit (INDEPENDENT_AMBULATORY_CARE_PROVIDER_SITE_OTHER): Payer: BC Managed Care – PPO | Admitting: Licensed Clinical Social Worker

## 2021-06-11 DIAGNOSIS — F411 Generalized anxiety disorder: Secondary | ICD-10-CM | POA: Diagnosis not present

## 2021-06-11 DIAGNOSIS — F4001 Agoraphobia with panic disorder: Secondary | ICD-10-CM

## 2021-06-11 NOTE — Progress Notes (Signed)
Virtual Visit via Video Note  I connected with Crystal Alexander on 38/18/29 at 10:00 AM EST by a video enabled telemedicine application and verified that I am speaking with the correct person using two identifiers.  Location: Patient: home Provider: home office   I discussed the limitations of evaluation and management by telemedicine and the availability of in person appointments. The patient expressed understanding and agreed to proceed.   I discussed the assessment and treatment plan with the patient. The patient was provided an opportunity to ask questions and all were answered. The patient agreed with the plan and demonstrated an understanding of the instructions.   The patient was advised to call back or seek an in-person evaluation if the symptoms worsen or if the condition fails to improve as anticipated.  I provided 54 minutes of non-face-to-face time during this encounter.  THERAPIST PROGRESS NOTE  Session Time: 10:00 AM to 10:54 AM  Participation Level: Active  Behavioral Response: CasualAlertAnxious and Depressed  Type of Therapy: Individual Therapy  Treatment Goals addressed: patient wants to feel at peace and feel normal again, reduce anxiety, panic, coping, work on grief, note impacts her anxiety  ProgressTowards Goals: Progressing-patient has significant anxiety but actively using session to help her work on it processing feeling talking about self talk and coping skills such as exercise will help address symptoms as well as some acceptance of waiting to find out about medical results this stressful  Interventions: Solution Focused, Strength-based, Supportive, Reframing, and Other: coping  Summary: Crystal Alexander is a 41 y.o. female who presents with catching up with therapist. Shared no longer at the job. 3 weeks into the job she was getting so dizzy, having problems with dizzy excited with job making friends and connections. First week was easy. 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 On lunch would do tredmill for 10-15 minutes. At end of the day all she could do was to crawl in bed and stay in bed until time to sleep. Took Xanax during the day small amounts helpful described anxiety came from feeling trapped know was working couldn't just take off.  Has been trying to desperately find the cause.  Describes anxiety from the job when she had to be on her own was getting a little more nervous. Didn't know enough may could have handed it if quality of life was better after work felt crappy, drained and just went to bed.  No of looking at all the screens was part of the problem. She resigned. Feeling more depressed, more anxiety feeling helpless now. Now extreme feelings but feeling pity party. Therapist provided reframe not the right job for her. Thinks in constant state of anxiety because of state of her health. Says anxiety can't be causing her to be dizzy all the time. Told her had Menire for 20 years when to ENT they look at MRI. The doctor says don't think have any Meniere disease. Her problems is more the vertigo constantly dizzy stress makes it worse when she is anxious stress makes it worse. Scared of doctor, had vestibular testing, scary everything is normal so think vestibular migraines. Now go to another doctor worse nightmare but have to figure out because dizziness. Transitioned to talking about went to doctor in February for blood work some weird blood work maybe something rheumatoid. Was having pvc's where heart skips a beat and  beat hard catching saw on apple watch. Having a lot of them. What thinking happening was drinking lots of water wiping out electrolytes which can cause pvc's why went to PCP. Referred to cardiologist to put on heart monitoring.  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 Nobody would call her back. The lack of empathy for office when didn't hear back. Saw tests but when finally talk to doctor said she needed to know what that means. When finally talked to doctor don't see physical symptoms so not diagnosing yet. Doctor said what going to do now has to do Eco stress and patient nervous again. Has asthma, anxiety, has to hit target heart rate. Why doing the tredmill to condition a little, and also to help her with the anxiety. Dizzy could also be a heart condition and that scares her. Scleredema cause heart and lung problems autoimmune disease can make too much collagen. Is it going to be the one impacts the internal body. Not diagnosed more test if have it make sure not impacting lung and heart. They thought could have lupus but don't have that.  Describes how she experiences panic with other people that does not want to show it to people so panic internalized uncomfortable having panic attack feel like going to faint.  Cholesterol was high said the way she was going could have a heart attack in the next 5 to 10 years hearing that was very scary therapist reframed that to say but there are medications that you can stabilize. Hearing those words that scared her enough to try to eat a salad or vegetable heavy. Limit saturated fats. Going to get healthier, do stress test may have long COVID syndrome.  Happy they are going to check for all that so she knows everything is good okay. Another test that she has to had, chest x-ray normal which as awesome. That gave her so positive vibes for the journey. Has to have GI-barium swallow test. Check for the scleroderma has acid reflux connected also check if ulcer tumors, hiatal hernia lesions. Dong all those crazy things not happy don't like going to doctor has to go through though and hopefully peace of mind and if not at least it was caught. Feels in depressed  hole and feels bad for herself. Feels sad. She is 40 but tries to say people have terrible diseases trying to talk herself down chill out and can be worse. Feeling nervous don't feel young fatigued and dizzy. Looking back a few years ago out living life and having fun and now terrible place feeling like crap. Feeling sad about it.  We processed patient said makes her think about everybody go through these horrible sessions of your life.  Therapist added getting to the source will help her with symptoms           Therapist reviewed symptoms, facilitated expression of thoughts and feelings reviewed significant events as its been a couple months since therapist has seen patient and there is been a lot of things that have developed since last session.  Patient reviewed potential medical issues and anxiety that it is causing.  Therapist validated and also related that people going through issues like this waiting for results experience this type of anxiety no matter how much they try to talk himself out of it to help and validating patient and coping the way  she is.  Assessed positive and also provided positive feedback for patient's realization that even though she hates test that she needs to go through this to find out what is going on and therapist said finding the sources can help her with symptoms hopefully such as the dizziness.  Therapist utilize things like quotes pass on insight such as "it is okay to not be okay".  Developing insight as we talked that everybody goes through their periods of difficulty and this is for her what is happening hopefully this phase will pass.  Did focusing on what she has control low and feel better as well as adjusting her issues by doing things like having a better diet exercising.  Therapist noted additionally with exercising one of the key strategies for mental health because it releases endorphins, helps you feel good about yourself, helps put you in a mindset where you feel  better so that we will help with coping.  Also help patient to direct energy to think she can control will help her feel better about being resourceful and doing what she can.  Therapist provided space and support for patient as she shared her thoughts and feelings in session.  Therapist noted ruling out medical issues as important to find out what is going on in being able to help patient find strategies to cope with her symptoms getting to the source is going to be helpful for that.  This provided strength based interventions, reframing, active listening open questions. Suicidal/Homicidal: No  Plan: Return again in 4 weeks.2.  Look at different information on anxiety as your tango, self talk from anxiety book psychology tools handout on thinking as well as helping patient to process and cope with specific stressors relevant to her and utilize reframing to help her with symptom  Diagnosis: Panic with agoraphobia, generalized anxiety disorder  Collaboration of Care: Medication Management AEB review of psychiatrist, Dr. De Nurse note  Patient/Guardian was advised Release of Information must be obtained prior to any record release in order to collaborate their care with an outside provider. Patient/Guardian was advised if they have not already done so to contact the registration department to sign all necessary forms in order for [place] to release information regarding their care.   Consent: Patient/Guardian gives verbal consent for treatment and assignment of benefits for services provided during this visit. Patient/Guardian expressed understanding and agreed to proceed.   Cordella Register, LCSW 06/11/2021

## 2021-06-23 ENCOUNTER — Ambulatory Visit (HOSPITAL_COMMUNITY): Payer: BC Managed Care – PPO | Admitting: Licensed Clinical Social Worker

## 2021-06-26 ENCOUNTER — Telehealth: Payer: Self-pay | Admitting: Student

## 2021-06-26 NOTE — Telephone Encounter (Signed)
Called and spoke with patient. She stated that she is currently scheduled for a PFT tomorrow at 12pm and then an echo with [facility] at 2pm. She had been reading about the PFTs online and stated that some of the websites mentioned having to breathe in gases. I made her aware that there are no gases but she will have to take 4 puffs of albuterol or levalbuterol depending on her cardio history. She stated that she is already on levalbuterol.  ? ?She wanted to know if it would be ok to do both tests on the same day. I advised her that since she will have to take double amount of lealbuterol, it made interfere with her echo results. She verbalized understanding and will reschedule her PFT.  ? ?She is scheduled to see Dr. Verlee Monte next month for a consult.  ? ?Nothing further needed at time of call.  ?

## 2021-06-30 ENCOUNTER — Other Ambulatory Visit: Payer: Self-pay | Admitting: Nurse Practitioner

## 2021-06-30 DIAGNOSIS — K219 Gastro-esophageal reflux disease without esophagitis: Secondary | ICD-10-CM

## 2021-06-30 DIAGNOSIS — M359 Systemic involvement of connective tissue, unspecified: Secondary | ICD-10-CM

## 2021-06-30 DIAGNOSIS — R768 Other specified abnormal immunological findings in serum: Secondary | ICD-10-CM

## 2021-07-01 ENCOUNTER — Other Ambulatory Visit: Payer: Self-pay

## 2021-07-01 ENCOUNTER — Ambulatory Visit
Admission: RE | Admit: 2021-07-01 | Discharge: 2021-07-01 | Disposition: A | Discharge status: Home / Self Care | Payer: BC Managed Care – PPO | Source: Ambulatory Visit | Attending: Nurse Practitioner | Admitting: Nurse Practitioner

## 2021-07-01 ENCOUNTER — Other Ambulatory Visit: Payer: Self-pay | Admitting: Nurse Practitioner

## 2021-07-01 ENCOUNTER — Telehealth: Payer: Self-pay | Admitting: Student

## 2021-07-01 DIAGNOSIS — K219 Gastro-esophageal reflux disease without esophagitis: Secondary | ICD-10-CM

## 2021-07-01 DIAGNOSIS — R768 Other specified abnormal immunological findings in serum: Secondary | ICD-10-CM

## 2021-07-01 DIAGNOSIS — R7689 Other specified abnormal immunological findings in serum: Secondary | ICD-10-CM

## 2021-07-01 DIAGNOSIS — M359 Systemic involvement of connective tissue, unspecified: Secondary | ICD-10-CM

## 2021-07-01 NOTE — Telephone Encounter (Signed)
Called and spoke with patient.  Patient is scheduled 07/25/21 for pulmonary consult with Dr. Verlee Monte and has PFT scheduled 08/06/21. Patient has been started on Metoprolol last week and has read that albuterol and levalbuterol may cause interactions.  Patient is concerned that she may have issues with PFT.  Patient stated she has completed echo and has HRCT scheduled checking for possible PH, scleroderma, or possible autoimmune, rheumatology issue.  Patient is requesting if she needs to continue to keep scheduled  PFT, can she not have albuterol or levabuterol during PFT.  ?Patient is aware Dr. Verlee Monte is not in office today, but will return 07/02/21.  ? ?Message routed to Dr.Meier to advise once he returns to office  ?

## 2021-07-03 ENCOUNTER — Ambulatory Visit
Admission: RE | Admit: 2021-07-03 | Discharge: 2021-07-03 | Disposition: A | Discharge status: Home / Self Care | Payer: BC Managed Care – PPO | Source: Ambulatory Visit | Attending: Nurse Practitioner | Admitting: Nurse Practitioner

## 2021-07-03 DIAGNOSIS — M359 Systemic involvement of connective tissue, unspecified: Secondary | ICD-10-CM

## 2021-07-03 DIAGNOSIS — R7689 Other specified abnormal immunological findings in serum: Secondary | ICD-10-CM

## 2021-07-03 DIAGNOSIS — R768 Other specified abnormal immunological findings in serum: Secondary | ICD-10-CM

## 2021-07-04 ENCOUNTER — Other Ambulatory Visit: Payer: BC Managed Care – PPO

## 2021-07-04 NOTE — Telephone Encounter (Signed)
Patient checking on message. Patient phone number is [phone]. ?

## 2021-07-07 ENCOUNTER — Telehealth (HOSPITAL_COMMUNITY): Payer: BC Managed Care – PPO | Admitting: Psychiatry

## 2021-07-10 ENCOUNTER — Ambulatory Visit (INDEPENDENT_AMBULATORY_CARE_PROVIDER_SITE_OTHER): Payer: 59 | Admitting: Licensed Clinical Social Worker

## 2021-07-10 DIAGNOSIS — F411 Generalized anxiety disorder: Secondary | ICD-10-CM | POA: Diagnosis not present

## 2021-07-10 DIAGNOSIS — F4001 Agoraphobia with panic disorder: Secondary | ICD-10-CM

## 2021-07-10 NOTE — Progress Notes (Signed)
Virtual Visit via Video Note ? ?I connected with Crystal Alexander on 96/28/36 at 11:00 AM EDT by a video enabled telemedicine application and verified that I am speaking with the correct person using two identifiers. ? ?Location: ?Patient: home ?Provider: home office ?  ?I discussed the limitations of evaluation and management by telemedicine and the availability of in person appointments. The patient expressed understanding and agreed to proceed. ? ?I discussed the assessment and treatment plan with the patient. The patient was provided an opportunity to ask questions and all were answered. The patient agreed with the plan and demonstrated an understanding of the instructions. ?  ?The patient was advised to call back or seek an in-person evaluation if the symptoms worsen or if the condition fails to improve as anticipated. ? ?I provided 53 minutes of non-face-to-face time during this encounter. ? ?THERAPIST PROGRESS NOTE ? ?Session Time: 11:00 AM to 11:53 AM ? ?Participation Level: Active ? ?Behavioral Response: CasualAlertEuthymic patient euthymic in session but reports depression anxiety ? ?Type of Therapy: Individual Therapy ? ?Treatment Goals addressed: patient wants to feel at peace and feel normal again, reduce anxiety, panic, coping, work on grief, note impacts her anxiety ? ?ProgressTowards Goals: Progressing-patient still has significant issues but utilizing therapy to actively work on issues processing feelings identifying helpful coping, finding positive reframe perspective on grief process helpful for coping ? ?Interventions: Solution Focused, Strength-based, Supportive, Reframing, and Other: grief ? ?Summary: Crystal Alexander is a 41 y.o. female who presents with luckily able to get tests done in past two weeks stress test echocardiogram scared to do it hate going to doctor, scared with panic also thought of being out of shape doing test did it, heart looked fine. Abnormal heart beats, getting a beta  blocker. Doesn't love it freaks her out but on the positive note haven't had some side effects such as helps with anxiety.  Can help with heart rate, migraines. Because nervous her heart rate will be 120's and not doing anything running high. Now heart rate is slowing down and helping. Still taking Xanax still feels the need for it. Two other tests had positive resolution Cat chest scan went in room by herself which is big for her. 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 Autobody that is in her can lead to scleroderma which could be scary and not good, the test was all good but discovered sliding hiatal hernia not worried about explains have had acid reflux. Already takes.something for that. Autoimmune disease worse with inflammation start eating diet to stop the progressive. Can help with mental health. If your body has of lot of inflammatory can see if skin is red and joint pain. Blood test shows high inflammation. Didn't realize how bad it is for body. Causes heart disease. Functional medicine believes this and makes sense. Has done whole 30 diet over the years. Eating whole foods no process food or packaged food, not weird ingredients, no carb, no sugar, no dairy when she did that for 30 days. Lost 10 pounds. Eating what she wants. Felt so much better. No acid reflux. Paleo is the same thing. Has not been eating Gluten-wheat, barley. Lost 20 lbs. Belly is going. Still has dizziness. Not resolved glad that test came back good. Funk in past week. Did know beta blocker can make you feel fatigued was not motivated. Should be so happy that tests came back good part of the reason deflated because of dizziness and there is  no reason for it. Vestibular migraines maybe and also reading about neck neck issues for awhile cervical vertigo could have something compressed affect how brain perceives motion and movement and can make dizzy. Have to see a neurologist.  Try this because nobody helping her figure out what is wrong, a year sick of not driving and working being so dizzy that can barely walk through house. So dizzy feels like on motion ride. Not anxiety. Not scarred of it had it so long now and knowing that heart and lung ok makes her feel better but has to figure what is going on with  ?the dizziness. Therapist pointed out can't function. Has had bouts but not constant like this. Exasperating factors death of boyfriend, stress like that, emotional stress connected to autoimmune issues can wake that up because stresses body, inflammation in body. Broken heart syndrome. Stress affect body, stress in body impacts emotional health. At a point rather live and feel well. Never felt so unwell in all her life not function, no energy and feel like crap all the time doesn't even feel like knows herself and desperate to fee better even if it is eating natural foods. Days in bed dizziness feels like crap cycle in bed doesn't make her feel good no energy not moving her body. When feels good can get up. Not normal but not woozy feeling. Able to do basic stuff today whereas couldn't take a shower so dizzy. In the background boyfriend still sad and damaged by it. Has been focused on health issues going, wife had health issues and she is doing great. Stressful for past couple of weeks tests scared could be wrong with heart and lungs related to disease. Think about him everyday a big part of her life sad to miss that prat of her life. Loves her wife. Haven't seen him in years-8 years. Haven't seen his face still connected to brother and sister. Therapist pointed out time changes grief patient says even a year and half not as fresh. 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   ? ?Therapist reviewed symptoms, facilitated expression of  thoughts and feelings and spent time with patient updating therapist to current status with medical issues.  Patient able to focus on some of the positive news that is helpful for coping but at the same time identifying issues that are roadblocks for her moving forward mainly the dizziness.  Therapist normally would think doing research on her health may feed into health anxiety but in this case it actually patient using her own resources for her own healing.  She is able to do research to present to neurologist to look into different things as they have not uncovered the source for dizziness.  Noted importance for patient of diet therapist providing her input she is hearing as well helpful for mental health symptoms, patient is aware of this so we will have positive effects hopefully on inflammation, health, mental health and patient will update therapist.  Noted significant aspect dizziness has to mental health patient agrees.  Worked on grief issues with patient noting grief will change over time narrative will change and that we will make things less hard.  Guided hope [facility] is a grief worry source for patient to look at.  Talked about grave changes where you are able to find ways to connect or at  the beginning it just is a painful experience.  Therapist provided active listening open questions supportive interventions. ?Suicidal/Homicidal: No ? ?Plan: Return again in 4 weeks.2.Look at grief handout, talk about [facility], look at book ruminating thoughts, anxiety book-self talk, psychology tools-anxiety ? ?Diagnosis: Panic with agoraphobia, generalized anxiety disorder ? ?Collaboration of Care: Other none needed ? ?Patient/Guardian was advised Release of Information must be obtained prior to any record release in order to collaborate their care with an outside provider. Patient/Guardian was advised if they have not already done so to contact the registration department to sign all necessary forms in order  for [place] to release information regarding their care.  ? ?Consent: Patient/Guardian gives verbal consent for treatment and assignment of benefits for services provided during this visit. Patient/Guardian expr

## 2021-07-16 ENCOUNTER — Institutional Professional Consult (permissible substitution): Payer: BC Managed Care – PPO | Admitting: Student

## 2021-07-25 ENCOUNTER — Institutional Professional Consult (permissible substitution): Payer: BC Managed Care – PPO | Admitting: Student

## 2021-08-04 ENCOUNTER — Telehealth (HOSPITAL_COMMUNITY): Payer: Self-pay

## 2021-08-04 ENCOUNTER — Other Ambulatory Visit: Payer: Self-pay | Admitting: Psychiatry

## 2021-08-04 ENCOUNTER — Other Ambulatory Visit (HOSPITAL_COMMUNITY): Payer: Self-pay

## 2021-08-04 MED ORDER — BUSPIRONE HCL 7.5 MG PO TABS: 7.5000 mg | ORAL_TABLET | Freq: Every day | ORAL | 0 refills | Status: AC

## 2021-08-04 MED ORDER — ALPRAZOLAM 0.5 MG PO TABS: ORAL_TABLET | ORAL | 0 refills | Status: DC

## 2021-08-04 NOTE — Telephone Encounter (Signed)
Ordered.   I have utilized the  Controlled Substances Reporting System (PMP [id number]) to confirm adherence regarding the patient's medication. My review reveals appropriate prescription fills.

## 2021-08-04 NOTE — Telephone Encounter (Signed)
Patient called requesting a refill on Alprazolam. ?[facility] [address] in [place] ?

## 2021-08-05 ENCOUNTER — Other Ambulatory Visit: Payer: Self-pay | Admitting: Student

## 2021-08-05 DIAGNOSIS — J4520 Mild intermittent asthma, uncomplicated: Secondary | ICD-10-CM

## 2021-08-06 ENCOUNTER — Ambulatory Visit (INDEPENDENT_AMBULATORY_CARE_PROVIDER_SITE_OTHER): Payer: 59 | Admitting: Student

## 2021-08-06 DIAGNOSIS — J4520 Mild intermittent asthma, uncomplicated: Secondary | ICD-10-CM | POA: Diagnosis not present

## 2021-08-06 LAB — PULMONARY FUNCTION TEST
DL/VA % pred: 126 %
DL/VA: 5.54 ml/min/mmHg/L
DLCO cor % pred: 122 %
DLCO cor: 28.52 ml/min/mmHg
DLCO unc % pred: 122 %
DLCO unc: 28.52 ml/min/mmHg
FEF 25-75 Pre: 3.92 L/sec
FEF2575-%Pred-Pre: 120 %
FEV1-%Pred-Pre: 108 %
FEV1-Pre: 3.5 L
FEV1FVC-%Pred-Pre: 103 %
FEV6-%Pred-Pre: 105 %
FEV6-Pre: 4.09 L
FEV6FVC-%Pred-Pre: 101 %
FVC-%Pred-Pre: 103 %
FVC-Pre: 4.09 L
Pre FEV1/FVC ratio: 85 %
Pre FEV6/FVC Ratio: 100 %

## 2021-08-06 NOTE — Progress Notes (Signed)
PFT done today. 

## 2021-08-08 ENCOUNTER — Ambulatory Visit (HOSPITAL_COMMUNITY): Payer: Self-pay | Admitting: Licensed Clinical Social Worker

## 2021-08-13 ENCOUNTER — Encounter (HOSPITAL_COMMUNITY): Payer: Self-pay | Admitting: Psychiatry

## 2021-08-13 ENCOUNTER — Telehealth (INDEPENDENT_AMBULATORY_CARE_PROVIDER_SITE_OTHER): Payer: 59 | Admitting: Psychiatry

## 2021-08-13 DIAGNOSIS — F4001 Agoraphobia with panic disorder: Secondary | ICD-10-CM

## 2021-08-13 DIAGNOSIS — F411 Generalized anxiety disorder: Secondary | ICD-10-CM | POA: Diagnosis not present

## 2021-08-13 NOTE — Progress Notes (Signed)
[place] Follow up visit  ? ?Patient Identification: Koi Henrene Pastor ?MRN:  [id number] ?Date of Evaluation:  08/13/2021 ?Referral Source: primary care ?Chief Complaint: follow up anxiety, panic attacks ?Visit Diagnosis:  ?  ICD-10-CM   ?1. Panic disorder with agoraphobia  F40.01   ?  ?2. GAD (generalized anxiety disorder)  F41.1   ?  ? ?Virtual Visit via Video Note ? ?I connected with Elinora Kever on 75/64/33 at 10:00 AM EDT by a video enabled telemedicine application and verified that I am speaking with the correct person using two identifiers. ? ?Location: ?Patient: home ?Provider: home office ?  ?I discussed the limitations of evaluation and management by telemedicine and the availability of in person appointments. The patient expressed understanding and agreed to proceed. ? ? ?  ?I discussed the assessment and treatment plan with the patient. The patient was provided an opportunity to ask questions and all were answered. The patient agreed with the plan and demonstrated an understanding of the instructions. ?  ?The patient was advised to call back or seek an in-person evaluation if the symptoms worsen or if the condition fails to improve as anticipated. ? ?I provided 15 minutes of non-face-to-face time during this encounter. ? ? ? ? ? ?History of Present Illness: Patient is a 41  years old married Caucasian female she works as a Geophysicist/field seismologist.  Her parents live in the same street she takes care of her mom who has parkinsonism.  Referred initially  by primary care physician for establish care for anxiety and panic attacks ? ? ?Her wife has been diagnosed with intracranial hypertension ,she is recovering and back to starting work ? ?Patient going thru holter monitoring, has had pvc and missed beats now on metoprolol has helped, her pulse rate is more control and anxiety improved ?She still struggles to go out , drive and gets anxious takes xanax ? ?Therapy helping ? ?Aggravating factors; pandemic, mom has  parkinsonism. Medical comorbidity ?Modifying factors; wife ? ?Severity : fluctuates but xanax and med help ? ?Past Psychiatric History: anxiety, panic attacks ? ?Previous Psychotropic Medications: Yes  ? ?Substance Abuse History in the last 12 months:  No. ? ?Consequences of Substance Abuse: ?NA ? ?Past Medical History:  ?Past Medical History:  ?Diagnosis Date  ? Abnormal Pap smear of cervix   ? 3/99 cin2 &3  ? Asthma   ? Fibroadenoma of breast   ?  breast biopsy x 2  ? HPV (human papilloma virus) infection   ? Meniere's disease   ?  ?Past Surgical History:  ?Procedure Laterality Date  ? CERVICAL BIOPSY  W/ LOOP ELECTRODE EXCISION  1999  ? negative margins  ? LEEP    ? ? ?Family Psychiatric History: mom: anxiety ? ?Family History:  ?Family History  ?Problem Relation Age of Onset  ? Hypertension Mother   ? Parkinson's disease Mother   ? Diabetes Father   ? Hypertension Paternal Grandmother   ? ? ?Social History:   ?Social History  ? ?Socioeconomic History  ? Marital status: Married  ?  Spouse name: Not on file  ? Number of children: Not on file  ? Years of education: Not on file  ? Highest education level: Not on file  ?Occupational History  ? Not on file  ?Tobacco Use  ? Smoking status: Former  ?  Types: Cigarettes  ? Smokeless tobacco: Never  ?Substance and Sexual Activity  ? Alcohol use: Yes  ?  Alcohol/week: 20.0 standard drinks  ?  Types: 20 Cans of beer per week  ?  Comment: in a month  ? Drug use: No  ? Sexual activity: Yes  ?  Partners: Female  ?  Birth control/protection: None  ?  Comment: Female partner  ?Other Topics Concern  ? Not on file  ?Social History Narrative  ? Not on file  ? ?Social Determinants of Health  ? ?Financial Resource Strain: Not on file  ?Food Insecurity: Not on file  ?Transportation Needs: Not on file  ?Physical Activity: Not on file  ?Stress: Not on file  ?Social Connections: Not on file  ? ? ? ?Allergies:  No Known Allergies ? ?Metabolic Disorder Labs: ?Lab Results  ?Component Value  Date  ? HGBA1C 5.3 12/26/2018  ? MPG 108 08/21/2015  ? ?Lab Results  ?Component Value Date  ? PROLACTIN 13.1 08/07/2015  ? ?Lab Results  ?Component Value Date  ? CHOL 225 (H) 12/26/2018  ? TRIG 223 (H) 12/26/2018  ? HDL 43 12/26/2018  ? CHOLHDL 5.2 (H) 12/26/2018  ? VLDL 28 08/07/2015  ? LDLCALC 142 (H) 12/26/2018  ? LDLCALC 153 (H) 08/07/2015  ? ?Lab Results  ?Component Value Date  ? TSH 3.240 12/26/2018  ? ? ?Therapeutic Level Labs: ?No results found for: LITHIUM ?No results found for: CBMZ ?No results found for: VALPROATE ? ?Current Medications: ?Current Outpatient Medications  ?Medication Sig Dispense Refill  ? ALPRAZolam (XANAX) 0.5 MG tablet TAKE 1 TABLET BY MOUTH EVERY DAY AS NEEDED FOR ANXIETY 30 tablet 0  ? busPIRone (BUSPAR) 7.5 MG tablet Take 1 tablet (7.5 mg total) by mouth daily. 90 tablet 0  ? fluticasone (FLOVENT HFA) 110 MCG/ACT inhaler Inhale 1 puff into the lungs daily.    ? metFORMIN (GLUCOPHAGE) 1000 MG tablet Take 1,000 mg by mouth 2 (two) times daily.    ? triamterene-hydrochlorothiazide (DYAZIDE) 37.5-25 MG capsule Take 1 each (1 capsule total) by mouth daily. 30 capsule 6  ? ?No current facility-administered medications for this visit.  ? ? ? ? ?Psychiatric Specialty Exam: ?Review of Systems  ?Cardiovascular:  Negative for chest pain.  ?Psychiatric/Behavioral:  Negative for agitation, hallucinations and self-injury.    ?There were no vitals taken for this visit.There is no height or weight on file to calculate BMI.  ?General Appearance: Casual  ?Eye Contact:  Fair  ?Speech:  Clear and Coherent  ?Volume:  Normal  ?Mood: fair  ?Affect:  Full Range  ?Thought Process:  Goal Directed  ?Orientation:  Full (Time, Place, and Person)  ?Thought Content:  Rumination  ?Suicidal Thoughts:  No  ?Homicidal Thoughts:  No  ?Memory:  Immediate;   Fair ?Recent;   Fair  ?Judgement:  Fair  ?Insight:  Fair  ?Psychomotor Activity:  Normal  ?Concentration:  Concentration: Fair and Attention Span: Fair  ?Recall:   Good  ?Fund of [place]  ?Language: Good  ?Akathisia:  No  ?Handed:   ?AIMS (if indicated):  not done  ?Assets:  Communication Skills ?Desire for Improvement ?Financial Resources/Insurance ?Social Support  ?ADL's:  Intact  ?Cognition: WNL  ?Sleep:   variable  ? ?Screenings: ?PHQ2-9   ? ?Flowsheet Row Counselor from 08/21/2020 in [place] [facility] Video Visit from 07/12/2020 in [facility]  ?PHQ-2 Total Score 2 0  ?PHQ-9 Total Score 7 --  ? ?  ? ?Flowsheet Row Video Visit from 08/13/2021 in [facility] Video Visit from 06/10/2021 in [facility] Video  Visit from 04/08/2021 in [place]  ?C-SSRS RISK CATEGORY No Risk No Risk No Risk  ? ?  ? ? ?Assessment and Plan: as follows ?Prior documentation reviewed ? ? ?Panic disorder with agoraphobia;some better, has lowered buspar ?Still struggles with anxiety, takes xanax small dose ? ?Has gone out more but short distance ? ? ?generalized anxiety disorder; worries related to health, some better  ?Continue meds and follow up therapy ? ?Fu 76mrenewed meds which were due ?NMerian Capron MD ?5/10/202310:11 AM ?

## 2021-08-20 NOTE — Progress Notes (Deleted)
Synopsis: Referred for ILD by Trey Sailors, PA  Subjective:   PATIENT ID: Crystal Alexander GENDER: female DOB: 03/30/1981, MRN: [id number]  No chief complaint on file.  40yF with history of asthma, meniere's, undifferentiated CTD - ANA+, anticentromere +  Otherwise pertinent review of systems is negative.  Past Medical History:  Diagnosis Date   Abnormal Pap smear of cervix    3/99 cin2 &3   Asthma    Fibroadenoma of breast     breast biopsy x 2   HPV (human papilloma virus) infection    Meniere's disease      Family History  Problem Relation Age of Onset   Hypertension Mother    Parkinson's disease Mother    Diabetes Father    Hypertension Paternal Grandmother      Past Surgical History:  Procedure Laterality Date   CERVICAL BIOPSY  W/ LOOP ELECTRODE EXCISION  1999   negative margins   LEEP      Social History   Socioeconomic History   Marital status: Married    Spouse name: Not on file   Number of children: Not on file   Years of education: Not on file   Highest education level: Not on file  Occupational History   Not on file  Tobacco Use   Smoking status: Former    Types: Cigarettes   Smokeless tobacco: Never  Substance and Sexual Activity   Alcohol use: Yes    Alcohol/week: 20.0 standard drinks    Types: 20 Cans of beer per week    Comment: in a month   Drug use: No   Sexual activity: Yes    Partners: Female    Birth control/protection: None    Comment: Female partner  Other Topics Concern   Not on file  Social History Narrative   Not on file   Social Determinants of Health   Financial Resource Strain: Not on file  Food Insecurity: Not on file  Transportation Needs: Not on file  Physical Activity: Not on file  Stress: Not on file  Social Connections: Not on file  Intimate Partner Violence: Not on file     No Known Allergies   Outpatient Medications Prior to Visit  Medication Sig Dispense Refill   ALPRAZolam (XANAX) 0.5 MG  tablet TAKE 1 TABLET BY MOUTH EVERY DAY AS NEEDED FOR ANXIETY 30 tablet 0   busPIRone (BUSPAR) 7.5 MG tablet Take 1 tablet (7.5 mg total) by mouth daily. 90 tablet 0   fluticasone (FLOVENT HFA) 110 MCG/ACT inhaler Inhale 1 puff into the lungs daily.     metFORMIN (GLUCOPHAGE) 1000 MG tablet Take 1,000 mg by mouth 2 (two) times daily.     triamterene-hydrochlorothiazide (DYAZIDE) 37.5-25 MG capsule Take 1 each (1 capsule total) by mouth daily. 30 capsule 6   No facility-administered medications prior to visit.       Objective:   Physical Exam:  General appearance: 41 y.o., female, NAD, conversant  Eyes: anicteric sclerae; PERRL, tracking appropriately HENT: NCAT; MMM Neck: Trachea midline; no lymphadenopathy, no JVD Lungs: CTAB, no crackles, no wheeze, with normal respiratory effort CV: RRR, no murmur  Abdomen: Soft, non-tender; non-distended, BS present  Extremities: No peripheral edema, warm Skin: Normal turgor and texture; no rash Psych: Appropriate affect Neuro: Alert and oriented to person and place, no focal deficit     There were no vitals filed for this visit.   on *** LPM *** RA BMI Readings from Last 3 Encounters:  01/31/20  35.51 kg/m  12/26/18 35.40 kg/m  08/07/15 32.27 kg/m   Wt Readings from Last 3 Encounters:  01/31/20 220 lb (99.8 kg)  12/26/18 221 lb (100.2 kg)  08/07/15 203 lb (92.1 kg)     CBC    Component Value Date/Time   WBC 16.2 (H) 01/31/2020 2146   RBC 4.90 01/31/2020 2146   HGB 14.3 01/31/2020 2146   HGB 14.6 12/26/2018 0910   HGB 14.4 07/31/2014 0851   HCT 43.1 01/31/2020 2146   HCT 43.0 12/26/2018 0910   PLT 394 01/31/2020 2146   PLT 366 12/26/2018 0910   MCV 88.0 01/31/2020 2146   MCV 87 12/26/2018 0910   MCH 29.2 01/31/2020 2146   MCHC 33.2 01/31/2020 2146   RDW 13.7 01/31/2020 2146   RDW 14.2 12/26/2018 0910   LYMPHSABS 2.2 01/31/2020 2146   MONOABS 1.5 (H) 01/31/2020 2146   EOSABS 0.1 01/31/2020 2146   BASOSABS 0.1  01/31/2020 2146     Chest Imaging:  HRCT Chest 07/03/21 reviewed by me - agree no changes suggestive of ILD. Patulous esophagus.  Esophagram 07/01/21 mild dysmotility, mildly patulous esophagus with small sliding hiatal hernia  Pulmonary Functions Testing Results:    Latest Ref Rng & Units 08/06/2021   12:05 PM  PFT Results  FVC-Pre L 4.09    FVC-Predicted Pre % 103    Pre FEV1/FVC % % 85    FEV1-Pre L 3.50    FEV1-Predicted Pre % 108    DLCO uncorrected ml/min/mmHg 28.52    DLCO UNC% % 122    DLCO corrected ml/min/mmHg 28.52    DLCO COR %Predicted % 122    DLVA Predicted % 126     PFT reviewed by me - elevated diffusing capacity, otherwise unremarkable  Echocardiogram:   Stress TTE 06/27/21: Normal RV, normal RVSP     Assessment & Plan:   #   Plan:      Crystal Hurter, MD [facility] 08/20/2021 5:51 PM

## 2021-08-22 ENCOUNTER — Institutional Professional Consult (permissible substitution): Payer: 59 | Admitting: Student

## 2021-09-05 ENCOUNTER — Encounter: Payer: Self-pay | Admitting: Pulmonary Disease

## 2021-09-05 ENCOUNTER — Ambulatory Visit (INDEPENDENT_AMBULATORY_CARE_PROVIDER_SITE_OTHER): Payer: 59 | Admitting: Pulmonary Disease

## 2021-09-05 VITALS — BP 124/72 | HR 86 | Ht 66.0 in | Wt 205.2 lb

## 2021-09-05 DIAGNOSIS — J4520 Mild intermittent asthma, uncomplicated: Secondary | ICD-10-CM | POA: Diagnosis not present

## 2021-09-05 DIAGNOSIS — K224 Dyskinesia of esophagus: Secondary | ICD-10-CM | POA: Diagnosis not present

## 2021-09-05 NOTE — Patient Instructions (Signed)
You do not have evidence of interstitial lung disease at this time. Your CT Chest and lung function tests are normal.   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   Continue to sleep with the head of the bed elevated  Eat dinner/snacks at least 2 hours before bedtime  Continue to use flovent inhaler as needed  Follow up in 1 year with pulmonary function tests

## 2021-09-05 NOTE — Progress Notes (Signed)
Synopsis: Referred in June 2023 for connective tissue disorder by Fonnie Jarvis, NP  Subjective:   PATIENT ID: Crystal Alexander GENDER: female DOB: May 24, 1980, MRN: [id number]  HPI  Chief Complaint  Patient presents with   Consult    Referred by PCP for abnormal lab and history of asthma. States she has SOB on and off for the past year.    Crystal Alexander is a 41 year old woman, with history of asthma and mixed connective tissue disease who is referred to pulmonary for evaluation of connective tissue associated lung disease.   She is followed by Dr. Dossie Der of [facility]. She has positive ANA 1:1280, centromere pattern and anticentromere protein B elevated at 138. Other immune testing is negative. She has diffuse joint pains that will come and go.  She had HRCT Chest on 07/03/21 which was unremarkable with no airway abnormalities or interstitial findings. She does have patulous esophagus and small hiatal hernia.  PFTs 08/06/21 which were within normal limits.  Esophogram on 07/01/21 showed mildly patulous esophagus with small sliding hiatal hernia and mild esophageal dysmotility. No stricture or mass noted.  She complains of intermittent shortness of breath and wheezing that is relieved with Flovent 156mg 2 puffs twice daily as needed.  She does have reflux issues. She is sleeping with head of bed elevated. She is taking famotidine '20mg'$  at night.   She is a former smoker with 12 pack year history, she quit in 2011. She does not vape. 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   Past Medical History:  Diagnosis Date   Abnormal Pap smear of cervix    3/99 cin2 &3   Asthma    Fibroadenoma of breast     breast biopsy x 2   HPV (human papilloma virus) infection    Meniere's disease      Family History  Problem Relation Age of Onset   Hypertension Mother    Parkinson's disease Mother    Diabetes Father    Hypertension Paternal Grandmother      Social  History   Socioeconomic History   Marital status: Married    Spouse name: Not on file   Number of children: Not on file   Years of education: Not on file   Highest education level: Not on file  Occupational History   Not on file  Tobacco Use   Smoking status: Former    Types: Cigarettes   Smokeless tobacco: Never  Substance and Sexual Activity   Alcohol use: Yes    Alcohol/week: 20.0 standard drinks    Types: 20 Cans of beer per week    Comment: in a month   Drug use: No   Sexual activity: Yes    Partners: Female    Birth control/protection: None    Comment: Female partner  Other Topics Concern   Not on file  Social History Narrative   Not on file   Social Determinants of Health   Financial Resource Strain: Not on file  Food Insecurity: Not on file  Transportation Needs: Not on file  Physical Activity: Not on file  Stress: Not on file  Social Connections: Not on file  Intimate Partner Violence: Not on file     No Known Allergies   Outpatient Medications Prior to Visit  Medication Sig Dispense Refill   ALPRAZolam (XANAX) 0.5 MG tablet TAKE 1 TABLET BY MOUTH EVERY DAY AS NEEDED FOR ANXIETY 30 tablet 0   busPIRone (BUSPAR) 7.5  MG tablet Take 1 tablet (7.5 mg total) by mouth daily. 90 tablet 0   famotidine (PEPCID) 20 MG tablet Take 20 mg by mouth at bedtime.     fluticasone (FLOVENT HFA) 110 MCG/ACT inhaler Inhale 1 puff into the lungs in the morning and at bedtime.     metoprolol succinate (TOPROL-XL) 25 MG 24 hr tablet Take 25 mg by mouth daily.     rosuvastatin (CRESTOR) 10 MG tablet Take 10 mg by mouth at bedtime.     metFORMIN (GLUCOPHAGE) 1000 MG tablet Take 1,000 mg by mouth 2 (two) times daily.     triamterene-hydrochlorothiazide (DYAZIDE) 37.5-25 MG capsule Take 1 each (1 capsule total) by mouth daily. 30 capsule 6   No facility-administered medications prior to visit.    Review of Systems  Constitutional:  Negative for chills, fever, malaise/fatigue and  weight loss.  HENT:  Positive for congestion. Negative for sinus pain and sore throat.   Eyes: Negative.   Respiratory:  Positive for cough, sputum production and shortness of breath. Negative for hemoptysis and wheezing.   Cardiovascular:  Negative for chest pain, palpitations, orthopnea, claudication and leg swelling.  Gastrointestinal:  Positive for heartburn. Negative for abdominal pain, nausea and vomiting.  Genitourinary: Negative.   Musculoskeletal:  Positive for joint pain. Negative for myalgias.  Skin:  Negative for rash.  Neurological:  Positive for headaches. Negative for weakness.  Endo/Heme/Allergies: Negative.   Psychiatric/Behavioral:  The patient is nervous/anxious.      Objective:   Vitals:   09/05/21 1039  BP: 124/72  Pulse: 86  SpO2: 98%  Weight: 205 lb 3.2 oz (93.1 kg)  Height: '5\' 6"'$  (1.676 m)     Physical Exam Constitutional:      General: She is not in acute distress.    Appearance: She is obese. She is not ill-appearing.  HENT:     Head: Normocephalic and atraumatic.  Eyes:     General: No scleral icterus.    Conjunctiva/sclera: Conjunctivae normal.     Pupils: Pupils are equal, round, and reactive to light.  Cardiovascular:     Rate and Rhythm: Normal rate and regular rhythm.     Pulses: Normal pulses.     Heart sounds: Normal heart sounds. No murmur heard. Pulmonary:     Effort: Pulmonary effort is normal.     Breath sounds: Normal breath sounds. No wheezing, rhonchi or rales.  Abdominal:     General: Bowel sounds are normal.     Palpations: Abdomen is soft.  Musculoskeletal:     Right lower leg: No edema.     Left lower leg: No edema.  Lymphadenopathy:     Cervical: No cervical adenopathy.  Skin:    General: Skin is warm and dry.  Neurological:     General: No focal deficit present.     Mental Status: She is alert.  Psychiatric:        Mood and Affect: Mood normal.        Behavior: Behavior normal.        Thought Content: Thought  content normal.        Judgment: Judgment normal.      CBC    Component Value Date/Time   WBC 16.2 (H) 01/31/2020 2146   RBC 4.90 01/31/2020 2146   HGB 14.3 01/31/2020 2146   HGB 14.6 12/26/2018 0910   HGB 14.4 07/31/2014 0851   HCT 43.1 01/31/2020 2146   HCT 43.0 12/26/2018 0910   PLT 394 01/31/2020  2146   PLT 366 12/26/2018 0910   MCV 88.0 01/31/2020 2146   MCV 87 12/26/2018 0910   MCH 29.2 01/31/2020 2146   MCHC 33.2 01/31/2020 2146   RDW 13.7 01/31/2020 2146   RDW 14.2 12/26/2018 0910   LYMPHSABS 2.2 01/31/2020 2146   MONOABS 1.5 (H) 01/31/2020 2146   EOSABS 0.1 01/31/2020 2146   BASOSABS 0.1 01/31/2020 2146      Latest Ref Rng & Units 01/31/2020    9:46 PM 12/26/2018    9:10 AM  BMP  Glucose 70 - 99 mg/dL 131   102    BUN 6 - 20 mg/dL 13   10    Creatinine 0.44 - 1.00 mg/dL 0.80   0.77    BUN/Creat Ratio 9 - 23  13    Sodium 135 - 145 mmol/L 138   139    Potassium 3.5 - 5.1 mmol/L 3.5   4.0    Chloride 98 - 111 mmol/L 104   103    CO2 22 - 32 mmol/L 23   19    Calcium 8.9 - 10.3 mg/dL 9.3   9.5       Chest imaging: HRCT Chest 07/03/21 Mediastinum/Nodes: Small hiatal hernia. Thyroid is unremarkable. No pathologically enlarged lymph nodes seen in the chest.   Lungs/Pleura: Central airways are patent. No air trapping. No consolidation, pleural effusion or pneumothorax. No reticular opacities, traction bronchiectasis or honeycomb change.  PFT:    Latest Ref Rng & Units 08/06/2021   12:05 PM  PFT Results  FVC-Pre L 4.09    FVC-Predicted Pre % 103    Pre FEV1/FVC % % 85    FEV1-Pre L 3.50    FEV1-Predicted Pre % 108    DLCO uncorrected ml/min/mmHg 28.52    DLCO UNC% % 122    DLCO corrected ml/min/mmHg 28.52    DLCO COR %Predicted % 122    DLVA Predicted % 126    2023: Within normal limits  Labs:  Path:  Echo 06/27/21: LV EF 60-65%. RV size and function is normal. LA is normal. RA is normal.   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   She has now pulmonary involvement at this time. Her recent PFTs and HRCT Chest are unremarkable.   We discussed GERD therapy with elevating the head of the bed and eating at least 2 hours prior to bedtime. She will be working on weight loss.   We will refer to GI for further evaluation of her esophageal dysmotility.  Follow up in 1 year for monitoring.  Freda Jackson, MD [facility] Office: [phone]   See Amion for personal pager PCCM on call pager [phone] until 7pm. Please call Elink 7p-7a. [phone]     Current Outpatient Medications:    ALPRAZolam (XANAX) 0.5 MG tablet, TAKE 1 TABLET BY MOUTH EVERY DAY AS NEEDED FOR ANXIETY, Disp: 30 tablet, Rfl: 0   busPIRone (BUSPAR) 7.5 MG tablet, Take 1 tablet (7.5 mg total) by  mouth daily., Disp: 90 tablet, Rfl: 0   famotidine (PEPCID) 20 MG tablet, Take 20 mg by mouth at bedtime., Disp: , Rfl:    fluticasone (FLOVENT HFA) 110 MCG/ACT inhaler, Inhale 1 puff into the lungs in the morning and at bedtime., Disp: , Rfl:    metoprolol succinate (TOPROL-XL) 25 MG 24 hr tablet, Take 25 mg by mouth daily., Disp: , Rfl:    rosuvastatin (CRESTOR) 10 MG tablet, Take 10 mg by mouth at bedtime., Disp: , Rfl:

## 2021-09-07 ENCOUNTER — Encounter: Payer: Self-pay | Admitting: Pulmonary Disease

## 2021-09-07 DIAGNOSIS — K449 Diaphragmatic hernia without obstruction or gangrene: Secondary | ICD-10-CM

## 2021-09-08 ENCOUNTER — Telehealth (HOSPITAL_COMMUNITY): Payer: Self-pay

## 2021-09-08 ENCOUNTER — Ambulatory Visit (INDEPENDENT_AMBULATORY_CARE_PROVIDER_SITE_OTHER): Payer: 59 | Admitting: Licensed Clinical Social Worker

## 2021-09-08 ENCOUNTER — Other Ambulatory Visit (HOSPITAL_COMMUNITY): Payer: Self-pay | Admitting: Psychiatry

## 2021-09-08 DIAGNOSIS — F4001 Agoraphobia with panic disorder: Secondary | ICD-10-CM

## 2021-09-08 DIAGNOSIS — F411 Generalized anxiety disorder: Secondary | ICD-10-CM

## 2021-09-08 MED ORDER — ALPRAZOLAM 0.5 MG PO TABS: ORAL_TABLET | ORAL | 0 refills | Status: DC

## 2021-09-08 NOTE — Progress Notes (Signed)
Virtual Visit via Video Note  I connected with Crystal Alexander on 60/[birth year]/40 at 10:00 AM EDT by a video enabled telemedicine application and verified that I am speaking with the correct person using two identifiers.  Location: Patient: home Provider: office   I discussed the limitations of evaluation and management by telemedicine and the availability of in person appointments. The patient expressed understanding and agreed to proceed.  I discussed the assessment and treatment plan with the patient. The patient was provided an opportunity to ask questions and all were answered. The patient agreed with the plan and demonstrated an understanding of the instructions.   The patient was advised to call back or seek an in-person evaluation if the symptoms worsen or if the condition fails to improve as anticipated.  I provided 54 minutes of non-face-to-face time during this encounter.  THERAPIST PROGRESS NOTE  Session Time: 10:00 AM to 10:54 AM  Participation Level: Active  Behavioral Response: CasualAlertAnxious  Type of Therapy: Individual Therapy  Treatment Goals addressed: patient wants to feel at peace and feel normal again, reduce anxiety, panic, coping, work on grief, note impacts her anxiety  ProgressTowards Goals: Progressing-has significant anxiety but actively working on coping strategies so she can get to a place of driving using CBT strategies to help her with anxiety  Interventions: CBT, Solution Focused, Strength-based, and Other: coping  Summary: Crystal Alexander is a 41 y.o. female who presents with getting out locally much better. Started beta blocker for Pvs thuds and funny flutters. Having this condition doesn't help with panic with panic heart feeling crazy and if feeling crazy even if no good reason get her panicky. Health causing symptoms give panic, at the same time panic drives her body with stress causing physical symptoms. Beta blocker think helping a bunch. Basically  heart rate when not on it has panic attacks heart rate is 170's slows heart rate down that helps a lot. Panicky heart rate doesn't get high stays low and that helps. Goes into catastrophic thinking. Going out more. Went to concerts one of her favorite things pre-pandemic. Knew it was going to be crazy and crowded, would have to stand. Music so loud beating through body wear ear plugs. Big accomplishment. Enjoyed it, took some Xanax. Quit drinking when mental health issues resurfaced. Didn't want anything that was a stimulant. Looking back used drinking as a soother to be social. Big able to go to concert and not drink. Fun enough anxious just a touch. Big thing for her knowing if stuck somewhere have to go to doctor's office more panicky in a room without windows talking about her health. Went to concert  2-3 weeks ago. Big thing tried to drive in an uncomfortable place had a panic attack. Vision getting blurry, panic attack world gets weird, perception of world get weird, gets dizzy and blurry, could feel heart beating out of chest. Those moments are traumatic hard to try again.  Had not been panicky for a while until that time. Explored with therapist what can she do in this scenery to help drive. So sad that can't drive. Slow down the gradual exposure. Lavender essential inhalers, Vics vaper rub has used and will try.  Worked on stuck point thoughts and patient says knows she thinks to her self doesn't trust herself afraid going to lose control.  Hard on herself wants to be perfect. Discussed focus on and plan for herself is driving. Wants to drive again, applying for jobs again. Quit felt like a failure  and also not contributing to household, feels like a burden.  Therapist challenging patient's all or nothing thinking perfectionist thinking.  Patient says less dizzy beta blocker helped. Neurology appointment some dizziness. Look at a neck issue. Felt great the day driving, but panicked- insanely dizzy vision  blurry. Does something that gives her dizziness. Sent to a GI doctor hiatal hernia. Had dizziness before not afraid of it the way she was. Also weirdness with esophagus. Have seen every medical specialist this year she describes as a "nightmare".  Therapist encouraged patient putting some focus on challenging thoughts through CBT and patient will get worksheets from therapist and plan to do that.    Therapist reviewed symptoms, facilitated expression of thoughts and feelings noted both progress and setbacks and work specifically on patient getting back to drive.  Therapist noted putting more elements together for a plan that patient had put in place that would be helpful including not underestimating the importance of ongoing CBT on a daily basis to challenge stuck point thoughts and to replace with more helpful thoughts.  Noted this has to be practice for the skill to get better therapist assesses as an important piece of keeping patient stuck with panic.  Therapist noted in session thought distortions such as all or nothing thinking catastrophic thinking and for these patterns to change they need to be challenged.  Other parts of the plan including slowing down exposure, sensory interventions to help her calm down.  Patient noted herself stuck point that she feels she cannot trust herself will lose control therapist encourage patient to start there we will send patient ABC sheets to work on this daily therapist provided active listening open questions supportive interventions. Suicidal/Homicidal: No  Plan: Return again in 7 weeks.2.  Patient work on CBT sheets to challenge her thoughts, therapist work with patient on strategies to help her with driving, work on trauma issues including grief issues, look at ruminating thoughts from anxiety book and other book on ruminating thoughts, can look at anxiety and self talk from anxiety book  Diagnosis: Panic with agoraphobia, generalized anxiety  disorder  Collaboration of Care: Medication Management AEB review of Dr. De Nurse note  Patient/Guardian was advised Release of Information must be obtained prior to any record release in order to collaborate their care with an outside provider. Patient/Guardian was advised if they have not already done so to contact the registration department to sign all necessary forms in order for [place] to release information regarding their care.   Consent: Patient/Guardian gives verbal consent for treatment and assignment of benefits for services provided during this visit. Patient/Guardian expressed understanding and agreed to proceed.   Cordella Register, LCSW 09/08/2021

## 2021-09-08 NOTE — Telephone Encounter (Signed)
New message      1. Which medications need to be refilled? (please list name of each medication and dose if known) ALPRAZolam (XANAX) 0.5 MG tablet  2. Which pharmacy/location (including street and city if local pharmacy) is medication to be sent to?[id number] [place], [place] - [id number]  3. Do they need a 30 day or 90 day supply? 30 days

## 2021-09-08 NOTE — Telephone Encounter (Signed)
Medication refill - message left for patient on her identifiable voicemail that Dr. Melanee Left, covering for Dr.Akhtar this week had sent in a one time refill of her requested [facility]olam [facility] [address] Requested pt call back if any issues.

## 2021-09-08 NOTE — Telephone Encounter (Signed)
Sent 30 with no refills

## 2021-09-22 ENCOUNTER — Other Ambulatory Visit: Payer: Self-pay | Admitting: Neurology

## 2021-09-22 DIAGNOSIS — M542 Cervicalgia: Secondary | ICD-10-CM

## 2021-10-04 ENCOUNTER — Other Ambulatory Visit: Payer: 59

## 2021-10-06 ENCOUNTER — Telehealth (HOSPITAL_COMMUNITY): Payer: Self-pay | Admitting: Psychiatry

## 2021-10-06 ENCOUNTER — Other Ambulatory Visit (HOSPITAL_COMMUNITY): Payer: Self-pay | Admitting: Psychiatry

## 2021-10-06 MED ORDER — ALPRAZOLAM 0.5 MG PO TABS
ORAL_TABLET | ORAL | 0 refills | Status: DC
Start: 2021-10-06 — End: 2021-10-09

## 2021-10-06 NOTE — Telephone Encounter (Signed)
sent 

## 2021-10-06 NOTE — Telephone Encounter (Signed)
Per pt Needs refill on xanax.  [facility] [address]  Last visit 5/10 Next visit 7/12

## 2021-10-08 ENCOUNTER — Other Ambulatory Visit (HOSPITAL_COMMUNITY): Payer: Self-pay | Admitting: Psychiatry

## 2021-10-08 ENCOUNTER — Telehealth (HOSPITAL_COMMUNITY): Payer: Self-pay

## 2021-10-08 NOTE — Telephone Encounter (Signed)
Medication management - Attempted to call pt to inform her requested refill of Alprazolam had been [facility] but got a message her phone had been disconnected.

## 2021-10-09 MED ORDER — ALPRAZOLAM 0.5 MG PO TABS: ORAL_TABLET | ORAL | 0 refills | Status: DC

## 2021-10-09 NOTE — Addendum Note (Signed)
Addended by: Merian Capron on: 10/09/2021 03:57 PM   Modules accepted: Orders

## 2021-10-09 NOTE — Telephone Encounter (Signed)
Can we [facility] to [facility] [address] in [place]? It did not go thru to their system when Dr. Melanee Left sent it on 07/03

## 2021-10-11 ENCOUNTER — Ambulatory Visit
Admission: RE | Admit: 2021-10-11 | Discharge: 2021-10-11 | Disposition: A | Discharge status: Home / Self Care | Payer: 59 | Source: Ambulatory Visit | Attending: Neurology | Admitting: Neurology

## 2021-10-11 DIAGNOSIS — M542 Cervicalgia: Secondary | ICD-10-CM

## 2021-10-15 ENCOUNTER — Telehealth (HOSPITAL_COMMUNITY): Payer: 59 | Admitting: Psychiatry

## 2021-10-18 ENCOUNTER — Other Ambulatory Visit: Payer: 59

## 2021-10-28 ENCOUNTER — Ambulatory Visit (HOSPITAL_COMMUNITY): Payer: 59 | Admitting: Licensed Clinical Social Worker

## 2021-11-03 ENCOUNTER — Encounter (HOSPITAL_COMMUNITY): Payer: Self-pay

## 2021-11-03 ENCOUNTER — Telehealth (HOSPITAL_COMMUNITY): Payer: 59 | Admitting: Psychiatry

## 2021-11-04 ENCOUNTER — Telehealth (HOSPITAL_COMMUNITY): Payer: 59 | Admitting: Psychiatry

## 2021-11-06 ENCOUNTER — Telehealth (INDEPENDENT_AMBULATORY_CARE_PROVIDER_SITE_OTHER): Payer: 59 | Admitting: Psychiatry

## 2021-11-06 ENCOUNTER — Encounter (HOSPITAL_COMMUNITY): Payer: Self-pay | Admitting: Psychiatry

## 2021-11-06 DIAGNOSIS — F4001 Agoraphobia with panic disorder: Secondary | ICD-10-CM | POA: Diagnosis not present

## 2021-11-06 DIAGNOSIS — F411 Generalized anxiety disorder: Secondary | ICD-10-CM

## 2021-11-06 MED ORDER — ALPRAZOLAM 0.5 MG PO TABS: ORAL_TABLET | ORAL | 1 refills | Status: DC

## 2021-11-06 MED ORDER — BUSPIRONE HCL 7.5 MG PO TABS: ORAL_TABLET | ORAL | 1 refills | Status: DC

## 2021-11-06 NOTE — Progress Notes (Signed)
[place] Follow up visit   Patient Identification: Crystal Alexander MRN:  [id number] Date of Evaluation:  11/06/2021 Referral Source: primary care Chief Complaint: follow up anxiety, panic attacks Visit Diagnosis:    ICD-10-CM   1. Panic disorder with agoraphobia  F40.01     2. GAD (generalized anxiety disorder)  F41.1     Virtual Visit via Video Note  I connected with Crystal Alexander on [birth year] at  4:00 PM EDT by a video enabled telemedicine application and verified that I am speaking with the correct person using two identifiers.  Location: Patient: home Provider: office   I discussed the limitations of evaluation and management by telemedicine and the availability of in person appointments. The patient expressed understanding and agreed to proceed.     I discussed the assessment and treatment plan with the patient. The patient was provided an opportunity to ask questions and all were answered. The patient agreed with the plan and demonstrated an understanding of the instructions.   The patient was advised to call back or seek an in-person evaluation if the symptoms worsen or if the condition fails to improve as anticipated.  I provided 15 minutes of non-face-to-face time during this encounter.        History of Present Illness: Patient is a 41  years old married Caucasian female she works as a Geophysicist/field seismologist.  Her parents live in the same street she takes care of her mom who has parkinsonism.  Referred initially  by primary care physician for establish care for anxiety and panic attacks   Her wife has been diagnosed with intracranial hypertension, she was recovering  Patient herself being evluated for dizziness, MRI shows cervical stenosis, etiology unclear of dizzines, it upset her  She is looking to start online job to keep busy  Takes xanax and small dose buspar helps with anxiety, panic attacks are less fequent  Therapy helping  Aggravating factors; pandemic, mom  has parkinsonism. Medical comorbidity Modifying factors; wife  Severity : fluctuates but xanax and med help  Past Psychiatric History: anxiety, panic attacks  Previous Psychotropic Medications: Yes   Substance Abuse History in the last 12 months:  No.  Consequences of Substance Abuse: NA  Past Medical History:  Past Medical History:  Diagnosis Date   Abnormal Pap smear of cervix    3/99 cin2 &3   Asthma    Fibroadenoma of breast     breast biopsy x 2   HPV (human papilloma virus) infection    Meniere's disease     Past Surgical History:  Procedure Laterality Date   CERVICAL BIOPSY  W/ LOOP ELECTRODE EXCISION  1999   negative margins   LEEP      Family Psychiatric History: mom: anxiety  Family History:  Family History  Problem Relation Age of Onset   Hypertension Mother    Parkinson's disease Mother    Diabetes Father    Hypertension Paternal Grandmother     Social History:   Social History   Socioeconomic History   Marital status: Married    Spouse name: Not on file   Number of children: Not on file   Years of education: Not on file   Highest education level: Not on file  Occupational History   Not on file  Tobacco Use   Smoking status: Former    Types: Cigarettes   Smokeless tobacco: Never  Substance and Sexual Activity   Alcohol use: Yes    Alcohol/week: 20.0 standard drinks of alcohol  Types: 20 Cans of beer per week    Comment: in a month   Drug use: No   Sexual activity: Yes    Partners: Female    Birth control/protection: None    Comment: Female partner  Other Topics Concern   Not on file  Social History Narrative   Not on file   Social Determinants of Health   Financial Resource Strain: Not on file  Food Insecurity: Not on file  Transportation Needs: Not on file  Physical Activity: Not on file  Stress: Not on file  Social Connections: Not on file     Allergies:  No Known Allergies  Metabolic Disorder Labs: Lab Results   Component Value Date   HGBA1C 5.3 12/26/2018   MPG 108 08/21/2015   Lab Results  Component Value Date   PROLACTIN 13.1 08/07/2015   Lab Results  Component Value Date   CHOL 225 (H) 12/26/2018   TRIG 223 (H) 12/26/2018   HDL 43 12/26/2018   CHOLHDL 5.2 (H) 12/26/2018   VLDL 28 08/07/2015   LDLCALC 142 (H) 12/26/2018   LDLCALC 153 (H) 08/07/2015   Lab Results  Component Value Date   TSH 3.240 12/26/2018    Therapeutic Level Labs: No results found for: "LITHIUM" No results found for: "CBMZ" No results found for: "VALPROATE"  Current Medications: Current Outpatient Medications  Medication Sig Dispense Refill   ALPRAZolam (XANAX) 0.5 MG tablet TAKE 1 TABLET BY MOUTH EVERY DAY AS NEEDED FOR ANXIETY 30 tablet 1   busPIRone (BUSPAR) 7.5 MG tablet 1 tablet 90 each 1   famotidine (PEPCID) 20 MG tablet Take 20 mg by mouth at bedtime.     fluticasone (FLOVENT HFA) 110 MCG/ACT inhaler Inhale 1 puff into the lungs in the morning and at bedtime.     metoprolol succinate (TOPROL-XL) 25 MG 24 hr tablet Take 25 mg by mouth daily.     rosuvastatin (CRESTOR) 10 MG tablet Take 10 mg by mouth at bedtime.     No current facility-administered medications for this visit.      Psychiatric Specialty Exam: Review of Systems  Cardiovascular:  Negative for chest pain.  Psychiatric/Behavioral:  Negative for agitation, hallucinations and self-injury.     There were no vitals taken for this visit.There is no height or weight on file to calculate BMI.  General Appearance: Casual  Eye Contact:  Fair  Speech:  Clear and Coherent  Volume:  Normal  Mood: fair  Affect:  Full Range  Thought Process:  Goal Directed  Orientation:  Full (Time, Place, and Person)  Thought Content:  Rumination  Suicidal Thoughts:  No  Homicidal Thoughts:  No  Memory:  Immediate;   Fair Recent;   Fair  Judgement:  Fair  Insight:  Fair  Psychomotor Activity:  Normal  Concentration:  Concentration: Fair and  Attention Span: Fair  Recall:  Good  Fund of Knowledge:Good  Language: Good  Akathisia:  No  Handed:   AIMS (if indicated):  not done  Assets:  Communication Skills Desire for Improvement Financial Resources/Insurance Social Support  ADL's:  Intact  Cognition: WNL  Sleep:   variable   Screenings: Camera operator Row Counselor from 08/21/2020 in [place] Video Visit from 07/12/2020 in [place]  PHQ-2 Total Score 2 0  PHQ-9 Total Score 7 --      Flowsheet Row Video Visit from 11/06/2021 in [place]  Video Visit from 08/13/2021 in [place] Video Visit from 06/10/2021 in [place] No Risk No Risk No Risk       Assessment and Plan: as follows  Prior documentation reivewed   Panic disorder with agoraphobia;not worse, continue buspar, xanax     generalized anxiety disorder;worries related to health, continue buspar, following with providers  Continue therapy, she feel med need not increased, refills sent  Fu3 renewed meds which were due Merian Capron, MD 8/3/20234:11 PM

## 2021-11-11 ENCOUNTER — Ambulatory Visit (INDEPENDENT_AMBULATORY_CARE_PROVIDER_SITE_OTHER): Payer: 59 | Admitting: Licensed Clinical Social Worker

## 2021-11-11 DIAGNOSIS — F4001 Agoraphobia with panic disorder: Secondary | ICD-10-CM

## 2021-11-11 DIAGNOSIS — F411 Generalized anxiety disorder: Secondary | ICD-10-CM | POA: Diagnosis not present
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
## 2022-01-05 ENCOUNTER — Telehealth (HOSPITAL_COMMUNITY): Payer: Self-pay

## 2022-01-05 MED ORDER — ALPRAZOLAM 0.5 MG PO TABS: ORAL_TABLET | ORAL | 1 refills | Status: DC

## 2022-01-05 NOTE — Telephone Encounter (Signed)
Medication refill - Call message from patient requesting a new Alprazolam 0.5 mg, one daily as needed, 30 [facility] [address] Last order supplied was from 11/06/21 + 1 refill and patient does not return until 01/12/22.

## 2022-01-05 NOTE — Telephone Encounter (Signed)
Medicationn management - Telephone call with patient to inform Dr. De Nurse had sent in her requested new Alprazolam order to [facility] [address].

## 2022-01-12 ENCOUNTER — Ambulatory Visit (INDEPENDENT_AMBULATORY_CARE_PROVIDER_SITE_OTHER): Payer: 59 | Admitting: Licensed Clinical Social Worker

## 2022-01-12 DIAGNOSIS — F4001 Agoraphobia with panic disorder: Secondary | ICD-10-CM | POA: Diagnosis not present

## 2022-01-12 DIAGNOSIS — F411 Generalized anxiety disorder: Secondary | ICD-10-CM | POA: Diagnosis not present

## 2022-01-12 NOTE — Progress Notes (Signed)
Virtual Visit via Video Note  I connected with Crystal Alexander on [birth year] at  1:00 PM EDT by a video enabled telemedicine application and verified that I am speaking with the correct person using two identifiers.  Location: Patient: home Provider: office   I discussed the limitations of evaluation and management by telemedicine and the availability of in person appointments. The patient expressed understanding and agreed to proceed.   I discussed the assessment and treatment plan with the patient. The patient was provided an opportunity to ask questions and all were answered. The patient agreed with the plan and demonstrated an understanding of the instructions.   The patient was advised to call back or seek an in-person evaluation if the symptoms worsen or if the condition fails to improve as anticipated.  I provided 41 minutes of non-face-to-face time during this encounter.  THERAPIST PROGRESS NOTE  Session Time: 1:00 PM to 1:52 PM  Participation Level: Active  Behavioral Response: CasualAlertAnxious and appropriate in session  Type of Therapy: Individual Therapy  Treatment Goals addressed: patient wants to feel at peace and feel normal again, reduce anxiety, panic, coping, work on grief, note impacts her anxiety  ProgressTowards Goals: Progressing-reviewed treatment goals noted progress and continue to work in therapy to continue progress  Interventions: Solution Focused, Strength-based, Supportive, and Other: Grief, coping  Summary: Crystal Alexander is a 41 y.o. female who presents with still working for [employer] she likes it. 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 Still not driving. Mental health doing better still progress to be made. When first started barely get in a car even as passenger. Really agoraphobic. 2 weeks ago went to [place] drove with dogs. Worked when there. 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 See a new rheumatologist at [facility] November 28 and excited about going to this reputable hospital system.  Patient explained in more detail for therapist understanding about her medical issues has the antibody for scleroderma but not diagnosis would be showing signs. Recheck blood work to see if antibody can fluctuate based on inflammation. DDD want to do spinal fusion which not going to PT. Going to integrative therapy and really likes it. Pt t thinks an eye tracking issue. Related to meniere's disease did vestibular testing and did not have it. 20 years did have then didn't have. Thinks she may have to be ok never knowing still hard to wrap her head around it. PT will do vestibular training with her.Nobody knows what the dizziness why not driving. May have persistent postural perception dizziness "unicorn disease" can't diagnosis. Deaf in one ear.  Headaches, migraines TMJ could be a lot of things hormones could be. Integrative medicine could offer a lot of benefits. Nervous system jacked up so can't have a massage. Ultimate plan back to driving dizziness huge problem. Plan to walk with a cart don't trust her body to take care of herself having dizzy spells. Happens randomly and daily fearful to be along and drive. Going to neurologist on Wednesday does get migraines. Finding with hormones about to start period and menstrual migraine dizzy days before. Little things to dizziness. Looking at [employer] get rid of inflammatory food and what have issues with. Glutin free already. Not going to take medication for autoimmune. Diet inflammatory food can help mental health. Look at a strategy of  taking care of health with food. Stuck to glutton free add another layer. Trying to stop soda. Do still have moments feel building to panic. 25 % there and then take med. Break Xanax into 1/4 can really levels  out a quarter of .5. Not had full blown panic attacks was having daily. Floating through it don't think can float though. Physical sensations body in emergency. Heart attack, can't breath cut off airway. Nothing has helped to slow down symptoms. Nobody can talk her down. Nice to not have panic for months. Working out. Goes to job 8-12 gets on tredmill knows have to do mentally what she used to do helped her mental health. Panic walking doesn't know what it is autoimmune causes breath more heavily too heavy panic comes. Fine line low and slow. Feels a lot better. Was in a dark, scary, sad "messed up place". Scared to be alive everything scared her. 70 % better. Able to go into store lighter, feels sad about boyfriend that passed but shifted. His death could have traumatized her body. Shared her life 12 years and he was young when passed. Too much for her. Knowing that he is gone. Has unpacked this, has tucked in a place, has to live somewhere else can't live with it they way she was everyday, can't carry the guilt. She thinks she was carrying why should be I be happy? Try to not to think of it often think about him but different. Explained the impact related to saw him everyday. Trying to live her life ok to be happy and have fun. Was trying to punish herself. Has another trip planned. Doing things like meditation for anxiety if dizziness under control would have more control but still doing things. Balance app.        Therapist reviewed symptoms, facilitated expression of thoughts and feelings positive review of treatment plan patient thinks she is about 70% better noted things have been better in her life although not back to the way they were.  Therapist encouraged patient and steps she is taking that we will help her continue with progress including exercise, travel, pushing herself despite obstacles such as dizziness.  Noted physical symptoms undoubtably play apart symptoms that can be interpreted as anxiety  such anxiety around driving.  Patient continue on pathways that provide possibilities of improvement including seeing a rheumatologist, Vestibular exercises, going to integrative care, being managed for her migraines, eating and healthy ways that can impact inflammation in her body.  Discussed grief patient has made progress therapist pointed out trauma related belief excessive guilt for something outside her control patient has awareness of being punishing to herself and assessed this is helpful to work through distortion and thought from more awareness and thought.  Completed treatment plan and patient gave consent to complete virtually. Suicidal/Homicidal: No  Plan: Return again in 2 months.2.  Explore any improvements in dizziness, continue to work on ruminating thoughts, anxiety, panic.  Diagnosis: Panic with agoraphobia, generalized anxiety disorder  Collaboration of Care: Other review of Dr. De Nurse note  Patient/Guardian was advised Release of Information must be obtained prior to any record release in order to collaborate their care with an outside provider. Patient/Guardian was advised if they have not already done so to contact the registration department to sign all necessary forms in order for [place] to release information regarding their care.   Consent: Patient/Guardian gives verbal consent for treatment and assignment of benefits for services provided during this visit. Patient/Guardian expressed understanding and  agreed to proceed.   Cordella Register, LCSW 01/12/2022

## 2022-01-14 ENCOUNTER — Ambulatory Visit (HOSPITAL_COMMUNITY): Payer: 59 | Admitting: Licensed Clinical Social Worker

## 2022-02-05 ENCOUNTER — Encounter (HOSPITAL_COMMUNITY): Payer: Self-pay | Admitting: Psychiatry

## 2022-02-05 ENCOUNTER — Telehealth (INDEPENDENT_AMBULATORY_CARE_PROVIDER_SITE_OTHER): Payer: 59 | Admitting: Psychiatry

## 2022-02-05 DIAGNOSIS — F4001 Agoraphobia with panic disorder: Secondary | ICD-10-CM | POA: Diagnosis not present

## 2022-02-05 DIAGNOSIS — F411 Generalized anxiety disorder: Secondary | ICD-10-CM

## 2022-02-05 MED ORDER — ALPRAZOLAM 0.5 MG PO TABS: ORAL_TABLET | ORAL | 2 refills | Status: DC

## 2022-02-05 NOTE — Progress Notes (Signed)
[facility] Follow up visit   Patient Identification: Crystal Alexander MRN:  [id number] Date of Evaluation:  02/05/2022 Referral Source: primary care Chief Complaint: follow up anxiety, panic attacks Visit Diagnosis:    ICD-10-CM   1. GAD (generalized anxiety disorder)  F41.1     2. Panic disorder with agoraphobia  F40.01     Virtual Visit via Video Note  I connected with Crystal Alexander on [birth year] at  2:00 PM EDT by a video enabled telemedicine application and verified that I am speaking with the correct person using two identifiers.  Location: Patient:home Provider: office   I discussed the limitations of evaluation and management by telemedicine and the availability of in person appointments. The patient expressed understanding and agreed to proceed.     I discussed the assessment and treatment plan with the patient. The patient was provided an opportunity to ask questions and all were answered. The patient agreed with the plan and demonstrated an understanding of the instructions.   The patient was advised to call back or seek an in-person evaluation if the symptoms worsen or if the condition fails to improve as anticipated.  I provided 15 minutes of non-face-to-face time during this encounter.        History of Present Illness: Patient is a 41  years old married Caucasian female she works as a Geophysicist/field seismologist.  Her parents live in the same street she takes care of her mom who has parkinsonism.  Referred initially  by primary care physician for establish care for anxiety and panic attacks   Her wife has been diagnosed with intracranial hypertension, she was recovering and is doing better  Patient got a job working with [place] clients with [employer] like the half day work remotely [place] gets anixious when goes outside  Metoprolol has helped SVT and anxiety  Not taking buspar, but has to take xanax for anxiety and panic    Therapy helping  Aggravating factors; pandemic,  mom has parkinsonism. Medical comorbidity Modifying factors; wife  Severity : fluctuates but xanax and med help  Past Psychiatric History: anxiety, panic attacks  Previous Psychotropic Medications: Yes   Substance Abuse History in the last 12 months:  No.  Consequences of Substance Abuse: NA  Past Medical History:  Past Medical History:  Diagnosis Date   Abnormal Pap smear of cervix    3/99 cin2 &3   Asthma    Fibroadenoma of breast     breast biopsy x 2   HPV (human papilloma virus) infection    Meniere's disease     Past Surgical History:  Procedure Laterality Date   CERVICAL BIOPSY  W/ LOOP ELECTRODE EXCISION  1999   negative margins   LEEP      Family Psychiatric History: mom: anxiety  Family History:  Family History  Problem Relation Age of Onset   Hypertension Mother    Parkinson's disease Mother    Diabetes Father    Hypertension Paternal Grandmother     Social History:   Social History   Socioeconomic History   Marital status: Married    Spouse name: Not on file   Number of children: Not on file   Years of education: Not on file   Highest education level: Not on file  Occupational History   Not on file  Tobacco Use   Smoking status: Former    Types: Cigarettes   Smokeless tobacco: Never  Substance and Sexual Activity   Alcohol use: Yes    Alcohol/week:  20.0 standard drinks of alcohol    Types: 20 Cans of beer per week    Comment: in a month   Drug use: No   Sexual activity: Yes    Partners: Female    Birth control/protection: None    Comment: Female partner  Other Topics Concern   Not on file  Social History Narrative   Not on file   Social Determinants of Health   Financial Resource Strain: Not on file  Food Insecurity: Not on file  Transportation Needs: Not on file  Physical Activity: Not on file  Stress: Not on file  Social Connections: Not on file     Allergies:  No Known Allergies  Metabolic Disorder Labs: Lab Results   Component Value Date   HGBA1C 5.3 12/26/2018   MPG 108 08/21/2015   Lab Results  Component Value Date   PROLACTIN 13.1 08/07/2015   Lab Results  Component Value Date   CHOL 225 (H) 12/26/2018   TRIG 223 (H) 12/26/2018   HDL 43 12/26/2018   CHOLHDL 5.2 (H) 12/26/2018   VLDL 28 08/07/2015   LDLCALC 142 (H) 12/26/2018   LDLCALC 153 (H) 08/07/2015   Lab Results  Component Value Date   TSH 3.240 12/26/2018    Therapeutic Level Labs: No results found for: "LITHIUM" No results found for: "CBMZ" No results found for: "VALPROATE"  Current Medications: Current Outpatient Medications  Medication Sig Dispense Refill   ALPRAZolam (XANAX) 0.5 MG tablet TAKE 1 TABLET BY MOUTH EVERY DAY AS NEEDED FOR ANXIETY 30 tablet 2   famotidine (PEPCID) 20 MG tablet Take 20 mg by mouth at bedtime.     fluticasone (FLOVENT HFA) 110 MCG/ACT inhaler Inhale 1 puff into the lungs in the morning and at bedtime.     metoprolol succinate (TOPROL-XL) 25 MG 24 hr tablet Take 25 mg by mouth daily.     rosuvastatin (CRESTOR) 10 MG tablet Take 10 mg by mouth at bedtime.     No current facility-administered medications for this visit.      Psychiatric Specialty Exam: Review of Systems  Cardiovascular:  Negative for chest pain.  Psychiatric/Behavioral:  Negative for agitation, hallucinations and self-injury.     There were no vitals taken for this visit.There is no height or weight on file to calculate BMI.  General Appearance: Casual  Eye Contact:  Fair  Speech:  Clear and Coherent  Volume:  Normal  Mood: fair  Affect:  Full Range  Thought Process:  Goal Directed  Orientation:  Full (Time, Place, and Person)  Thought Content:  Rumination  Suicidal Thoughts:  No  Homicidal Thoughts:  No  Memory:  Immediate;   Fair Recent;   Fair  Judgement:  Fair  Insight:  Fair  Psychomotor Activity:  Normal  Concentration:  Concentration: Fair and Attention Span: Fair  Recall:  Good  Fund of Knowledge:Good   Language: Good  Akathisia:  No  Handed:   AIMS (if indicated):  not done  Assets:  Communication Skills Desire for Improvement Financial Resources/Insurance Social Support  ADL's:  Intact  Cognition: WNL  Sleep:   variable   Screenings: Camera operator Row Counselor from 08/21/2020 in [facility] Video Visit from 07/12/2020 in [place]  PHQ-2 Total Score 2 0  PHQ-9 Total Score 7 --      Flowsheet Row Video Visit from 02/05/2022 in [place] Video Visit from 11/06/2021  in [place] Video Visit from 08/13/2021 in [place] No Risk No Risk No Risk       Assessment and Plan: as follows Prior documentation reviewed   Panic disorder with agoraphobia; manageable but still limits and not driving, continue therapy and xanax prn also on metoprolol Avoid coffee     generalized anxiety disorder;worries related with health, continue distraction and therapy with xanax if needed prn  Continue therapy, she feel med need not increased, refills sent  Fu3 renewed meds which were due Merian Capron, MD 11/2/20232:20 PM

## 2022-03-09 ENCOUNTER — Ambulatory Visit (INDEPENDENT_AMBULATORY_CARE_PROVIDER_SITE_OTHER): Payer: 59 | Admitting: Licensed Clinical Social Worker

## 2022-03-09 DIAGNOSIS — F411 Generalized anxiety disorder: Secondary | ICD-10-CM

## 2022-03-09 DIAGNOSIS — F4001 Agoraphobia with panic disorder: Secondary | ICD-10-CM | POA: Diagnosis not present

## 2022-03-09 NOTE — Progress Notes (Signed)
Virtual Visit via Video Note  I connected with Crystal Alexander on [birth year] at  4:00 PM EST by a video enabled telemedicine application and verified that I am speaking with the correct person using two identifiers.  Location: Patient: home Provider: office   I discussed the limitations of evaluation and management by telemedicine and the availability of in person appointments. The patient expressed understanding and agreed to proceed    I discussed the assessment and treatment plan with the patient. The patient was provided an opportunity to ask questions and all were answered. The patient agreed with the plan and demonstrated an understanding of the instructions.   The patient was advised to call back or seek an in-person evaluation if the symptoms worsen or if the condition fails to improve as anticipated.  I provided 53 minutes of non-face-to-face time during this encounter.  THERAPIST PROGRESS NOTE  Session Time: 4:00 PM to 4:53 PM  Participation Level: Active  Behavioral Response: CasualAlertAnxious and Dysphoric  Type of Therapy: Individual Therapy  Treatment Goals addressed: patient wants to feel at peace and feel normal again, reduce anxiety, panic, coping, work on grief, note impacts her anxiety  ProgressTowards Goals: Progressing-patient processed thoughts and feelings in session to help with coping as well as working on particular strategies for mood management and panic  Interventions: CBT, Solution Focused, Strength-based, Supportive, and Other: coping  Summary: Crystal Alexander is a 41 y.o. female who presents with physical therapy tough to get in so switched appointments to see therapist today.  Therapist reviewed whether anything urgent patient says has found out about autoimmune stuff nothing pressing but has a deductible why wanted to get this session in. Only development health wise want to put her on medication doctor said ok if she tries the dietary first. It would  be anti malarial medication but also is for auto-immune. Patient doesn't have to take if she doesn't have to. Has dizziness, pain most of the time muscle and joint pain looks at it like can live with it takes Doesn't want to take opioids for autoimmune don't want to take anything so heavy. Change dietary and eat anti-inflammatory foods if feel better can inspire stick to it hard core. See how it goes see if geel better and dizziness better. The dizziness could be autoimmune related. In pain for years on back burner livable 12 years. Late 20's when started joints hurt probably had autoimmune for quite some time but didn't know.  We have been focused on it because dizziness and panic taking precedence. Doing vestibular PT not good at PT a home. It is just life. Explained with this you do eye movements. Pt thinks she may have eye tracking issues plus the neck thing. A lot of little things that don't prevent her from living her life. Things could be worse trying to be optimistic little diagnoses probably connected stem from the autoimmune why feel like crap.  Does feel finding out autoimmune feels some movement forward and dressing issues.   As day goes on gets tired. Haven't been drinking drank two beets two months ago and feel like crap. 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 As soon as done with work get on treadmill have to so it before sit down. Thinks help with mental. Pre-pandemic went to gym cardio and weight now don't do weights not good for body. Patient says shut down with stress mom in bad shape she has taken a  decline she has Parkinson whole family stressed out constant texting with sister and dad all day long emotionally taxing all wants to do is stay on bed. Sticking to doing tredmill after work then go to [employer] do what have to do what have to do, crawl in bed afterwards. The stress of that affects autoimmune condition. Mom can be in fine place some days she  is ok but there is a change worse shape then she was. Would have her in nursing home but none of them want to do that. Incontinent. Dad doesn't work, have cameras, sister goes there doesn't work. Live on the same street. [place] her job is flexible can help. Constant and feeling really heavy. Close relationship with mom Dad stepped in to be a good partner which was shocked.both of parents health has been a lot. Thinks Mom has some dementia. Very heavy. She has been like this for years says weirdest stuff with parkinson can get psychosis hard to heard dementia. Mom had CP didn't work later. She did have kids and work when younger then dad worked and she stayed home homemaker. Got Parkinson's in early 58's escalated. Mom was best friend the role has reversed patient more the parent figure. Gets really angry hard not her has fits and makes patient angry about the whole situation. In a sad place a couple of sad years. Glad not depressed. Taking Xanax a little more often . 5 breaks it quarters. Did have panic attack at physical therapy. Wasn't a full blown scene worried about having scene was doing well heaviness thinks it is waking up panic. up. Physical therapy messaging went inwards heart racing short of breath felt had to get out felt trapped so compose herself to get out of there. Took Xanax. Able to get through the session put her off PT associate it with panic. Aversion to it. Stopped going to her. Switched PT talking to her like railroading conversation added to anxiety panic patient thinks. It was strange boundaries. Think doing ok dealing with life and stress can feel panic inward saying out loud puts out there helps to push it off. Once panic hard to forget weird cycle scared have another. Trying to meditate. Fall off the wagon. Not doing it a problem a great tool in her box for that. Did it last night and felt it really helps. Feels so much weight of the world somehow create this great thing welcome the  quiet guided meditation. Repeat things did in meditation longer outer breath. Insight Time. Feels like a therapy session afterwards. Some breathing, some progressive muscle. In her comfy place. Listen to calm voice and chill. Think when first seeing therapist and psychiatrist when panic was really bad grasping at straws finding anything that can help anything helps. Hate to have to take something but still take natural practices treadmill and meditation. Don't care to drive anywhere last thing on list. Nowhere to go. Don't plan to discontinue check in.      Noted patient is going through a heavy time with issues with her mom getting sicker validated patient on the struggles how it is a day-to-day all day issue with her symptoms at this point.  How difficult it is when parents get sick can be a trigger for panic.  Reviewed last panic attack encouraged patient not to go inward with thoughts but to go outward find distraction which she has insight and knows to do.  Found she needed another physical therapist when he was not monologue  conversation the whole time.  Noted patient is more attuned and mindful when she is going to have a panic noted this is following a good recovery program the more mindful the more she can cut it off or stop it noticed that she goes outward as therapist encouraged her to do saying it out loud.  Noted positive steps patient is taking noting how helpful meditation is also exercising.  Therapist continues to talk about cognitive strategies and it is a process of retraining her mind with doing something like thought stopping thought replacement the same we will reexercise and have to keep repeating to get her body in shape.  Assess helpful for patient to talk about thoughts and feelings in session to help with coping. Suicidal/Homicidal: No  Plan: Return again in 2 months. 2.  Work on coping with panic talk about YouTube video help patient coping with stressors  Diagnosis: Panic with  agoraphobia, generalized anxiety disorder  Collaboration of Care: Other none needed  Patient/Guardian was advised Release of Information must be obtained prior to any record release in order to collaborate their care with an outside provider. Patient/Guardian was advised if they have not already done so to contact the registration department to sign all necessary forms in order for [place] to release information regarding their care.   Consent: Patient/Guardian gives verbal consent for treatment and assignment of benefits for services provided during this visit. Patient/Guardian expressed understanding and agreed to proceed.   Cordella Register, LCSW 03/09/2022

## 2022-03-11 ENCOUNTER — Ambulatory Visit (HOSPITAL_COMMUNITY): Payer: 59 | Admitting: Licensed Clinical Social Worker

## 2022-05-11 ENCOUNTER — Telehealth (INDEPENDENT_AMBULATORY_CARE_PROVIDER_SITE_OTHER): Payer: 59 | Admitting: Psychiatry

## 2022-05-11 ENCOUNTER — Encounter (HOSPITAL_COMMUNITY): Payer: Self-pay | Admitting: Psychiatry

## 2022-05-11 DIAGNOSIS — F4001 Agoraphobia with panic disorder: Secondary | ICD-10-CM

## 2022-05-11 DIAGNOSIS — F411 Generalized anxiety disorder: Secondary | ICD-10-CM

## 2022-05-11 MED ORDER — ALPRAZOLAM 0.5 MG PO TABS: ORAL_TABLET | ORAL | 2 refills | Status: DC

## 2022-05-11 NOTE — Progress Notes (Signed)
[facility] Follow up visit   Patient Identification: Crystal Alexander MRN:  [id number] Date of Evaluation:  05/11/2022 Referral Source: primary care Chief Complaint: follow up anxiety, panic attacks Visit Diagnosis:    ICD-10-CM   1. GAD (generalized anxiety disorder)  F41.1     2. Panic disorder with agoraphobia  F40.01       Virtual Visit via Video Note  I connected with Arra Lohnes on 40/98/11 at  3:30 PM EST by a video enabled telemedicine application and verified that I am speaking with the correct person using two identifiers.  Location: Patient: home Provider: home office   I discussed the limitations of evaluation and management by telemedicine and the availability of in person appointments. The patient expressed understanding and agreed to proceed.      I discussed the assessment and treatment plan with the patient. The patient was provided an opportunity to ask questions and all were answered. The patient agreed with the plan and demonstrated an understanding of the instructions.   The patient was advised to call back or seek an in-person evaluation if the symptoms worsen or if the condition fails to improve as anticipated.  I provided 15 minutes of non-face-to-face time during this encounter.           History of Present Illness: Patient is a 42  years old married Caucasian female she works as a Geophysicist/field seismologist.  Her parents live in the same street she takes care of her mom who has parkinsonism.  Referred initially  by primary care physician for establish care for anxiety and panic attacks   Her wife has been diagnosed with intracranial hypertension, she was recovering and is doing better  Now working with customer service, with [place] company doing better regarding job, less anxious Getting metoprolol for SVT has helped indireclty anxiety or panic attacks, still has difficulty going out or thinking of driving, will continue to work in therapy Takes small dose  xanax helps as well    Aggravating factors; pandemic, mom has parkinsonism. Medical comorbidity Modifying factors; wife  Severity fluctuates but some better, except for agaophobia and driving  Past Psychiatric History: anxiety, panic attacks  Previous Psychotropic Medications: Yes   Substance Abuse History in the last 12 months:  No.  Consequences of Substance Abuse: NA  Past Medical History:  Past Medical History:  Diagnosis Date   Abnormal Pap smear of cervix    3/99 cin2 &3   Asthma    Fibroadenoma of breast     breast biopsy x 2   HPV (human papilloma virus) infection    Meniere's disease     Past Surgical History:  Procedure Laterality Date   CERVICAL BIOPSY  W/ LOOP ELECTRODE EXCISION  1999   negative margins   LEEP      Family Psychiatric History: mom: anxiety  Family History:  Family History  Problem Relation Age of Onset   Hypertension Mother    Parkinson's disease Mother    Diabetes Father    Hypertension Paternal Grandmother     Social History:   Social History   Socioeconomic History   Marital status: Married    Spouse name: Not on file   Number of children: Not on file   Years of education: Not on file   Highest education level: Not on file  Occupational History   Not on file  Tobacco Use   Smoking status: Former    Types: Cigarettes   Smokeless tobacco: Never  Substance and  Sexual Activity   Alcohol use: Yes    Alcohol/week: 20.0 standard drinks of alcohol    Types: 20 Cans of beer per week    Comment: in a month   Drug use: No   Sexual activity: Yes    Partners: Female    Birth control/protection: None    Comment: Female partner  Other Topics Concern   Not on file  Social History Narrative   Not on file   Social Determinants of Health   Financial Resource Strain: Not on file  Food Insecurity: Not on file  Transportation Needs: Not on file  Physical Activity: Not on file  Stress: Not on file  Social Connections: Not on  file     Allergies:  No Known Allergies  Metabolic Disorder Labs: Lab Results  Component Value Date   HGBA1C 5.3 12/26/2018   MPG 108 08/21/2015   Lab Results  Component Value Date   PROLACTIN 13.1 08/07/2015   Lab Results  Component Value Date   CHOL 225 (H) 12/26/2018   TRIG 223 (H) 12/26/2018   HDL 43 12/26/2018   CHOLHDL 5.2 (H) 12/26/2018   VLDL 28 08/07/2015   LDLCALC 142 (H) 12/26/2018   LDLCALC 153 (H) 08/07/2015   Lab Results  Component Value Date   TSH 3.240 12/26/2018    Therapeutic Level Labs: No results found for: "LITHIUM" No results found for: "CBMZ" No results found for: "VALPROATE"  Current Medications: Current Outpatient Medications  Medication Sig Dispense Refill   ALPRAZolam (XANAX) 0.5 MG tablet TAKE 1 TABLET BY MOUTH EVERY DAY AS NEEDED FOR ANXIETY 30 tablet 2   famotidine (PEPCID) 20 MG tablet Take 20 mg by mouth at bedtime.     fluticasone (FLOVENT HFA) 110 MCG/ACT inhaler Inhale 1 puff into the lungs in the morning and at bedtime.     metoprolol succinate (TOPROL-XL) 25 MG 24 hr tablet Take 25 mg by mouth daily.     rosuvastatin (CRESTOR) 10 MG tablet Take 10 mg by mouth at bedtime.     No current facility-administered medications for this visit.      Psychiatric Specialty Exam: Review of Systems  Psychiatric/Behavioral:  Negative for agitation, hallucinations and self-injury.     There were no vitals taken for this visit.There is no height or weight on file to calculate BMI.  General Appearance: Casual  Eye Contact:  Fair  Speech:  Clear and Coherent  Volume:  Normal  Mood: fair  Affect:  Full Range  Thought Process:  Goal Directed  Orientation:  Full (Time, Place, and Person)  Thought Content:  Rumination  Suicidal Thoughts:  No  Homicidal Thoughts:  No  Memory:  Immediate;   Fair Recent;   Fair  Judgement:  Fair  Insight:  Fair  Psychomotor Activity:  Normal  Concentration:  Concentration: Fair and Attention Span: Fair   Recall:  Good  Fund of Knowledge:Good  Language: Good  Akathisia:  No  Handed:   AIMS (if indicated):  not done  Assets:  Communication Skills Desire for Improvement Financial Resources/Insurance Social Support  ADL's:  Intact  Cognition: WNL  Sleep:   variable   Screenings: Web designer from 08/21/2020 in [place] at [facility] Video Visit from 07/12/2020 in [place] at [facility]  PHQ-2 Total Score 2 0  PHQ-9 Total Score 7 --      Flowsheet Row Video Visit from 02/05/2022 in [facility] at [facility] Video Visit from 11/06/2021 in [place] at [facility] Video Visit from 08/13/2021 in [place] at [facility] No Risk No Risk No Risk       Assessment and Plan: as follows \ Prior documentation reviewed   Panic disorder with agoraphobia; at home better, still has anxiety of driving, will work in therapy, continue xanax, b blocker   generalized anxiety disorder;worries related to health , continue therapy and xanax Depression : denies subdued mood, continue to work in therapy in regard to coping skills    Fu3 renewed meds which were due Merian Capron, MD 2/5/20243:34 PM

## 2022-05-12 ENCOUNTER — Ambulatory Visit (INDEPENDENT_AMBULATORY_CARE_PROVIDER_SITE_OTHER): Payer: 59 | Admitting: Licensed Clinical Social Worker

## 2022-05-12 DIAGNOSIS — F4001 Agoraphobia with panic disorder: Secondary | ICD-10-CM

## 2022-05-12 DIAGNOSIS — F411 Generalized anxiety disorder: Secondary | ICD-10-CM | POA: Diagnosis not present

## 2022-05-12 NOTE — Progress Notes (Signed)
THERAPIST PROGRESS NOTE  Session Time: 2:00 PM to 2:53 PM  Participation Level: Active  Behavioral Response: CasualAlertAnxious  Type of Therapy: Individual Therapy  Treatment Goals addressed:  patient wants to feel at peace and feel normal again, reduce anxiety, panic, coping, work on grief, note impacts her anxiety  ProgressTowards Goals: Progressing-noted control issues for patient as we worked on steps to start driving also worked on mindfulness to help with regulating thoughts and feelings  Interventions: Solution Focused, Strength-based, Supportive, and Other: Coping  Summary: Crystal Alexander is a 42 y.o. female who presents with when look back asks if had a mental breakdown. When look back at how it was barely could get out of bed laid in bed most of the day. Out of bed two hours helping mom difficult suffering emotionally very had having panic attacks. Wife very supportive. Patient working with therapist before issues came up about her wife and her stuff. Ex-boyfriend and his death she thinks threw into a mental breakdown. Been with him 11 years with wife 15 years. Somebody 11 years gone hard to shake. Never over. Good and bad days. In July three years. Beta blocker flipped a switch and  Xanax helped with panic. Helped her not dwell on it. Helps with heart palpitations so serves a dual purpose. When first heard about her boyfriends death all thought about has a vivid imagination and could visual the details replaying what happened that has gone away. Now better able to push away when appropriate. As far as massage doesn't think can handle the silence wants to go back but not sure. Driving still not driving don't care to drive but hates that. Hate it but become complacent. Don't have anywhere to go and gets help with driving. If wife driving thinks how she would feel would be panky too intense. How to work through that? Start small excuse every day.  More concerned with other people how to  control them as well as dizziness. Dissociate feeling dizzy am I dissociating? Sticking with her when had panic and had anxiety symptoms.  Patient does note a personality trait excessive need to control. Thinks that factored into when lost ex-boyfriend he passed away can't control. Now the world is dangerous and scary and everything can happen. On the road anyone can die at every moment. Driving overstimulated. Gets her irritable anxious. Goes to [facility] and she will make a noise, TV loud Dad loud when he speaks panky at parents road the same.  Patient can see work on letting go.  Her sister is able to stop the anxious thinking and tell herself to focus.  Patient says does not have that skill quick to react. Don't have time to think about it whether scared or sad.  Therapist noted mindfulness will help her with that Talked about meditation doesn't feel good little do but helps. One or two minutes can let go. Most relaxing 1-2 minutes.  Work at [employer] and like it. Happy at home.      Work with patient on issue of driving car and put different insights as well as coping for patient to think about and use.  Therapist said core of panic somewhat of the things people have to work on with panic and anxiety is realizing have to let go of control that there is a lot of things we cannot control and this relates to driving so at the same time realizing a more balanced perspective that risk is low.  Noted for  patient probably there is an unbalanced perspective she overestimates danger noted how this overlaps with trauma past experiences that distort her perspective as patient herself says worried about having another panic attack.  Noted keeping her mind out of the past into the present what happened before is not what is happening now.  So for patient working on letting go control.  Talked about regulation of thoughts and feelings for sister able to have thoughts and feelings stop therapist said 1 way  she can work on that is with mindfulness.  That it is a mindset helps [place] become more aware of thoughts and feelings so we can regulate them and we get this through practice reviewed some of the practices.  With mindfulness taking  undistracted time to become fully aware of your thoughts and feelings so he can have more choice and how you respond to them do I really agree with this thought or Viibryd pressured into believing it how do I want to respond to this feeling distract myself from it repress it expressed that or just feel it until changes into something else mindfulness is a perspective that weds your capacity for self observation with your instinct of self compassion.  Talked about video for patient to watch to give her more skills with panic Suicidal/Homicidal: No  Plan: Return again in 2 months.2.Patient look at video on panic attacks.3.  Patient work on steps to drive and use some of the coping skills including look at issues around control  Diagnosis: Panic with agoraphobia, generalized anxiety disorder  Collaboration of Care: Other none needed  Patient/Guardian was advised Release of Information must be obtained prior to any record release in order to collaborate their care with an outside provider. Patient/Guardian was advised if they have not already done so to contact the registration department to sign all necessary forms in order for [place] to release information regarding their care.   Consent: Patient/Guardian gives verbal consent for treatment and assignment of benefits for services provided during this visit. Patient/Guardian expressed understanding and agreed to proceed.   Cordella Register, LCSW 05/12/2022

## 2022-07-23 ENCOUNTER — Ambulatory Visit (INDEPENDENT_AMBULATORY_CARE_PROVIDER_SITE_OTHER): Payer: 59 | Admitting: Licensed Clinical Social Worker

## 2022-07-23 DIAGNOSIS — F411 Generalized anxiety disorder: Secondary | ICD-10-CM | POA: Diagnosis not present

## 2022-07-23 DIAGNOSIS — F4001 Agoraphobia with panic disorder: Secondary | ICD-10-CM | POA: Diagnosis not present

## 2022-07-23 NOTE — Progress Notes (Signed)
Virtual Visit via Video Note  I connected with Crystal Alexander on 07/23/22 at  2:00 PM EDT by a video enabled telemedicine application and verified that I am speaking with the correct person using two identifiers.  Location: Patient: home Provider: home office   I discussed the limitations of evaluation and management by telemedicine and the availability of in person appointments. The patient expressed understanding and agreed to proceed.   I discussed the assessment and treatment plan with the patient. The patient was provided an opportunity to ask questions and all were answered. The patient agreed with the plan and demonstrated an understanding of the instructions.   The patient was advised to call back or seek an in-person evaluation if the symptoms worsen or if the condition fails to improve as anticipated.  I provided 52 minutes of non-face-to-face time during this encounter.  THERAPIST PROGRESS NOTE  Session Time: 2:00 PM to 2:52 PM  Participation Level: Active  Behavioral Response: CasualAlertAnxious and so had positive mood in session  Type of Therapy: Individual Therapy  Treatment Goals addressed: patient wants to feel at peace and feel normal again, reduce anxiety, panic, coping, work on grief, note impacts her anxiety  ProgressTowards Goals: Progressing-processing the struggles with driving working on overcoming those difficulties  Interventions: CBT, Solution Focused, Strength-based, Supportive, and Other: coping  Summary: Crystal Alexander is a 42 y.o. female who presents with haven't had night panic attacks but a couple days ago bad dream heart rate shot up then threw into panic took a Xanax. Her thoughts were things like worry about having a panic attack. Feels good she used grounded and able to calm down.  Therapist noted other patients have said this is really been helpful and de-escalating anxiety.   Patient said main issue not driving and not working on it. Will  drive to mom's or shopping center. Don't nurture the thoughts to drive  says who cares don't care to drive, but also cares. Working from home doesn't have to get anywhere. Talking to therapist motivates her to reflect and talk about it. Pros/cons therapist proposed affects her mood to be isolated. The con doesn't have freedom have to depend on others. Has gotten complacent with life not losing weight used to go to gym, also play music and drive and get gift but easy to fall into this lifestyle because of modern convenience. Therapist suggested she needs to be pushed patient can see that supports need to push needs more encouragement. Wife is more passive. Do things at own pace. Sister think would be pushy but patient thinks feels bad worried about how patient react no treads. Dizziness changed mind frame mini panic because got dizziness never determined actually what it is. Has vestibular migraines knows when it is coming. Dizziness precursor to migraine.  Vertigo experiences can't make sense of the world. Once in a blue moon with vertigo but  dizziness makes her feel drunk overwhelms her and feels dissociate a little bit. Feels dangerous to drive feels doesn't have a good grip. Not 100% have control patient is type A want to be in control her thoughts say hazard to her or others sure wouldn't be. In mind. Long stretch not feeling weird if it is migraine and neck related multiple times a month. Comes with menstrual cycle. They have her trying Relivion.  Neurologist putting on alternative medications already feel weird so careful about meds. Had her on something to stimulate vagal nerve didn't work. What she has now is like Mining engineer  shock therapy electrodes around headband has to get the electrodes wet for migraine relief and anxiety relief.  She is with driving she could be a little dizzy but anxiety feeds into it and grows.  Talked about distraction is helpful patient said use that at night time panic trying to  distract. Knows looking at other sense take deep breaths what hearing, etc, Helped to de-escalate. Knows needs to put work into driving feels overwhelming.  Talked about trauma didn't think too bad until she thought about it was bullied watched somebody hit by car and die, loss of ex are all traumatic.  Looked at pros and cons of driving. Get back on road be happy otherwise can't massage if not mobile. Although worries about massaging. Wants to keep in bubble. Family made it comfortable. Also has autoimmune disease mixed connective tissue disease have problems with pain in body all the time, they say fibromylgia have odd autobodies in blood work not pegged down, body hurts a lot. Wanted to put her on major medicines looking at more alternatives. Does impact quality of life but noted some positive in her life likes her job. Sees her Mom every day Note in chart says she has dementia hard to see that in chart but does has hallucinations say the craziest things has Parkinson's hard to see her like that although sometimes fine, not out of mind will say something loopy at times. Good near by sees every day patient does the lunch shift. Everybody does shift Dad and sister take shifts although her and sister do most he is lazy. Wants to drive again. Now afraid a little to get in plane. Talked to customer about it who had same problem. Advice was to start a little bit and add to it. Customer also liked the conversation helps with connection. Patient says before driving wants to get a handle on panic.  When feels better she will feel she has more control to do the driving.     Noted major issue right now is driving patient has gotten to a comfort zone about it so has not focused on it.  Motivating to have session she says usually is to get herself motivated to continue to work on it.  Therapist noted motivational strategies include looking at the pros and cons patient recognizing does not have freedom would like to get  back to massage..  Looking for motivational statements and patient saying therapy motivates her is a motivational statement so positive.  Noted her physical issues feed into her panic when she is driving there is a dizziness that happens and anxiety escalates.  Talked about strategies with this including self assuring talk, using distraction purposely, saying herself will be all right even may be taking a Xanax or bringing it along may help.  Patient realizes that step-by-step was given advice by someone else with same issue to broadly expand where she drives.  Noted she can dissociate and therapist said that is a form of fight/flight in this case freeze so it is good to do grounding for that.  Therapist gave patient some recommendations for books to review about anxiety and about self therapy in general.  Patient also can a look at shadow workbook patient did not think she had trauma but as therapist explored with her saw her more was there in fact recognizes most people have some of trauma.  This pointed out what escalates patient she feeds into catastrophic thinking she did that with the night panic when she  thought blood pressure was rising therapist pointed out so patient begins to recognize that herself.  Therapist summarized therapy and that we are Working on learning to change how we think and respond to her panic attacks.  Therapist provided space and support for patient to talk about thoughts and feelings in session. Suicidal/Homicidal: No  Plan: Return again in 2 months.2.  Continue to work on panic and driving patient look at book recommendations "stop overthinking", "the art of self therapy" and also take a look at shadow workbook  Diagnosis: Panic with agoraphobia, generalized anxiety disorder  Collaboration of Care: Other none needed  Patient/Guardian was advised Release of Information must be obtained prior to any record release in order to collaborate their care with an outside provider.  Patient/Guardian was advised if they have not already done so to contact the registration department to sign all necessary forms in order for [place] to release information regarding their care.   Consent: Patient/Guardian gives verbal consent for treatment and assignment of benefits for services provided during this visit. Patient/Guardian expressed understanding and agreed to proceed.   Coolidge Breeze, LCSW 07/23/2022

## 2022-08-10 ENCOUNTER — Telehealth (HOSPITAL_COMMUNITY): Payer: Self-pay | Admitting: Psychiatry

## 2022-08-10 MED ORDER — ALPRAZOLAM 0.5 MG PO TABS: ORAL_TABLET | ORAL | 1 refills | Status: DC

## 2022-08-10 NOTE — Telephone Encounter (Signed)
Patient called requesting refill of: ALPRAZolam (XANAX) 0.5 MG tablet   [id number] [place], [place] - [address] (Ph: [phone])   Last ordered: 05/11/2022 - 30 tablets with 2 refills  Last visit: 05/11/2022  Next visit:08/24/2022

## 2022-08-11 NOTE — Telephone Encounter (Signed)
Medication management - Message left for patient that Dr. Gilmore Laroche had sent in her requested new Alprazolam 0.5 mg tablet order to [facility]'s [facility] on [address] as requested.

## 2022-08-24 ENCOUNTER — Telehealth (INDEPENDENT_AMBULATORY_CARE_PROVIDER_SITE_OTHER): Payer: 59 | Admitting: Psychiatry

## 2022-08-24 ENCOUNTER — Encounter (HOSPITAL_COMMUNITY): Payer: Self-pay | Admitting: Psychiatry

## 2022-08-24 DIAGNOSIS — F4001 Agoraphobia with panic disorder: Secondary | ICD-10-CM | POA: Diagnosis not present

## 2022-08-24 DIAGNOSIS — F411 Generalized anxiety disorder: Secondary | ICD-10-CM | POA: Diagnosis not present

## 2022-08-24 NOTE — Progress Notes (Signed)
[facility] Follow up visit   Patient Identification: Crystal Alexander MRN:  [id number] Date of Evaluation:  08/24/2022 Referral Source: primary care Chief Complaint: follow up anxiety, panic attacks Visit Diagnosis:    ICD-10-CM   1. GAD (generalized anxiety disorder)  F41.1     2. Panic disorder with agoraphobia  F40.01      Virtual Visit via Video Note  I connected with Crystal Alexander on 08/24/22 at  3:30 PM EDT by a video enabled telemedicine application and verified that I am speaking with the correct person using two identifiers.  Location: Patient: home Provider: home office   I discussed the limitations of evaluation and management by telemedicine and the availability of in person appointments. The patient expressed understanding and agreed to proceed.     I discussed the assessment and treatment plan with the patient. The patient was provided an opportunity to ask questions and all were answered. The patient agreed with the plan and demonstrated an understanding of the instructions.   The patient was advised to call back or seek an in-person evaluation if the symptoms worsen or if the condition fails to improve as anticipated.  I provided 15 minutes of non-face-to-face time during this encounter.    History of Present Illness: Patient is a 42  years old married Caucasian female she works as a Teacher, adult education.  Her parents live in the same street she takes care of her mom who has parkinsonism.  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 Medical comorbidity Modifying factors;  wife  Severity fluctuates but some  better, except for agaophobia and driving  Past Psychiatric History: anxiety, panic attacks  Previous Psychotropic Medications: Yes   Substance Abuse History in the last 12 months:  No.  Consequences of Substance Abuse: NA  Past Medical History:  Past Medical History:  Diagnosis Date   Abnormal Pap smear of cervix    3/99 cin2 &3   Asthma    Fibroadenoma of breast     breast biopsy x 2   HPV (human papilloma virus) infection    Meniere's disease     Past Surgical History:  Procedure Laterality Date   CERVICAL BIOPSY  W/ LOOP ELECTRODE EXCISION  1999   negative margins   LEEP      Family Psychiatric History: mom: anxiety  Family History:  Family History  Problem Relation Age of Onset   Hypertension Mother    Parkinson's disease Mother    Diabetes Father    Hypertension Paternal Grandmother     Social History:   Social History   Socioeconomic History   Marital status: Married    Spouse name: Not on file   Number of children: Not on file   Years of education: Not on file   Highest education level: Not on file  Occupational History   Not on file  Tobacco Use   Smoking status: Former    Types: Cigarettes   Smokeless tobacco: Never  Substance and Sexual Activity   Alcohol use: Yes    Alcohol/week: 20.0 standard drinks of alcohol    Types: 20 Cans of beer per week  Comment: in a month   Drug use: No   Sexual activity: Yes    Partners: Female    Birth control/protection: None    Comment: Female partner  Other Topics Concern   Not on file  Social History Narrative   Not on file   Social Determinants of Health   Financial Resource Strain: Not on file  Food Insecurity: Not on file  Transportation Needs: Not on file  Physical Activity: Not on file  Stress: Not on file  Social Connections: Not on file     Allergies:  No Known Allergies  Metabolic Disorder Labs: Lab Results  Component Value Date   HGBA1C 5.3 12/26/2018   MPG 108 08/21/2015    Lab Results  Component Value Date   PROLACTIN 13.1 08/07/2015   Lab Results  Component Value Date   CHOL 225 (H) 12/26/2018   TRIG 223 (H) 12/26/2018   HDL 43 12/26/2018   CHOLHDL 5.2 (H) 12/26/2018   VLDL 28 08/07/2015   LDLCALC 142 (H) 12/26/2018   LDLCALC 153 (H) 08/07/2015   Lab Results  Component Value Date   TSH 3.240 12/26/2018    Therapeutic Level Labs: No results found for: "LITHIUM" No results found for: "CBMZ" No results found for: "VALPROATE"  Current Medications: Current Outpatient Medications  Medication Sig Dispense Refill   ALPRAZolam (XANAX) 0.5 MG tablet TAKE 1 TABLET BY MOUTH EVERY DAY AS NEEDED FOR ANXIETY 30 tablet 1   famotidine (PEPCID) 20 MG tablet Take 20 mg by mouth at bedtime.     fluticasone (FLOVENT HFA) 110 MCG/ACT inhaler Inhale 1 puff into the lungs in the morning and at bedtime.     metoprolol succinate (TOPROL-XL) 25 MG 24 hr tablet Take 25 mg by mouth daily.     rosuvastatin (CRESTOR) 10 MG tablet Take 10 mg by mouth at bedtime.     No current facility-administered medications for this visit.      Psychiatric Specialty Exam: Review of Systems  Cardiovascular:  Negative for chest pain.  Psychiatric/Behavioral:  Negative for agitation, hallucinations and self-injury.     There were no vitals taken for this visit.There is no height or weight on file to calculate BMI.  General Appearance: Casual  Eye Contact:  Fair  Speech:  Clear and Coherent  Volume:  Normal  Mood: fair  Affect:  Full Range  Thought Process:  Goal Directed  Orientation:  Full (Time, Place, and Person)  Thought Content:  Rumination  Suicidal Thoughts:  No  Homicidal Thoughts:  No  Memory:  Immediate;   Fair Recent;   Fair  Judgement:  Fair  Insight:  Fair  Psychomotor Activity:  Normal  Concentration:  Concentration: Fair and Attention Span: Fair  Recall:  Good  Fund of Knowledge:Good  Language: Good  Akathisia:  No  Handed:   AIMS (if indicated):   not done  Assets:  Communication Skills Desire for Improvement Financial Resources/Insurance Social Support  ADL's:  Intact  Cognition: WNL  Sleep:   variable   Screenings: Insurance account manager from 08/21/2020 in [facility] Video Visit from 07/12/2020 in [facility] at [facility]  PHQ-2 Total Score 2 0  PHQ-9 Total Score 7 --      Flowsheet Row Video Visit from 02/05/2022 in [facility] Video Visit from 11/06/2021 in [facility] at [facility] Video Visit from  08/13/2021 in [facility] at [facility]  C-SSRS RISK CATEGORY No Risk No Risk No Risk       Assessment and Plan: as follows \ Prior documentation reviewed   Panic disorder with agoraphobia;at home does ok still stuggles to go out/driving   generalized anxiety disorder;worries related to health , fluctuates, at home better continue xanax and therapy Depression : fair, anxiety effects mood, continue therapy   Fu3 renewed meds which were due Thresa Ross, MD 5/20/20243:39 PM

## 2022-09-17 ENCOUNTER — Ambulatory Visit (HOSPITAL_COMMUNITY): Payer: 59 | Admitting: Licensed Clinical Social Worker

## 2022-09-30 ENCOUNTER — Ambulatory Visit (INDEPENDENT_AMBULATORY_CARE_PROVIDER_SITE_OTHER): Payer: 59 | Admitting: Licensed Clinical Social Worker

## 2022-09-30 DIAGNOSIS — F411 Generalized anxiety disorder: Secondary | ICD-10-CM

## 2022-09-30 DIAGNOSIS — F4001 Agoraphobia with panic disorder: Secondary | ICD-10-CM

## 2022-09-30 NOTE — Progress Notes (Addendum)
Virtual Visit via Video Note  I connected with Crystal Alexander on 10/02/22 at  3:00 PM EDT by a video enabled telemedicine application and verified that I am speaking with the correct person using two identifiers.  Location: Patient: home Provider: home office   I discussed the limitations of evaluation and management by telemedicine and the availability of in person appointments. The patient expressed understanding and agreed to proceed.   I discussed the assessment and treatment plan with the patient. The patient was provided an opportunity to ask questions and all were answered. The patient agreed with the plan and demonstrated an understanding of the instructions.   The patient was advised to call back or seek an in-person evaluation if the symptoms worsen or if the condition fails to improve as anticipated.  I provided 45 minutes of non-face-to-face time during this encounter.  THERAPIST PROGRESS NOTE  Session Time: 3:00 PM to 3:45 PM  Participation Level: Active  Behavioral Response: CasualAlertAnxious, Dysphoric, and Euthymic  Type of Therapy: Individual Therapy  Treatment Goals addressed: patient wants to feel at peace and feel normal again, reduce anxiety, panic, coping, work on grief, note impacts her anxiety, its first needs to work on panic in public before addressing driving issues  ProgressTowards Goals: Progressing-looking at the picture of anxiety for patient different factors play apart assess bibliotherapy helpful as we continue to look at ways to decrease anxiety the same time validating supporting and providing strength-based interventions.  Interventions: CBT, Solution Focused, Strength-based, and Supportive  Summary: Crystal Alexander is a 42 y.o. female who presents with therapist exploring how much medical issues play a part in this to better strategize for patient.  Main issues heart beats fast something with heart think it is panic go into a panic. Vicious  cycle rabbit hole ok can worry about worsening medical issue asking herself heart attack what happening. Dizzy have a migraine if behind the wheel will have panic. Still dizziness and vertigo neck messed up and migraine for a week.what if happened if driving jarring fear that keeps her from driving. She admits to babying herself ok not driving but did drive before but thinks worse. When it happened not in this state of mind didn't feel major fear. Metoprolol helps beta blocker doesn't have crazy heart thing. Fast acting and long extended release. Heart rate was going up and adrenaline rush. Also helps with anxiety. Take care of quickly doesn't happen as much as before. With public panic concerns migraine dizzy, neck messed up feel dizzy. Has to get a cart used to walk feels need an aid feel less panic walking may get vertigo and be unbalanced. Doctor said could plate in neck but could still have problems no rotation might not help with dizziness. Dizziness stems with neck and migraines. Fear of being alone leads her thoughts spinning randomly thought what if have to do jury duty. The thought gave her extreme anxiety. Thoughts spin limitations where could be which is are unlikely. She and wife have been on journey to figure out about her neck don't know why dizzy just guessing linked migraine tinnitus next day has migraine that is her warning sign has medications has helped. Medication take first onset of migraine knocks it out. Before would have one that would last three days. Maybe take something sooner when dizziness feel like that all day like drunk. Will ask doctor about it. Warning sign migraine coming. From neck stenosis in neck pushing on spinal column. Doesn't know if feel comfortable  on own, more days doesn't feel good almost never feel well autoimmune-mixed connective tissue disorder, fibromyalgia in pain a lot not taking med doesn't want to be on meds for that, neck issues. All probably connected but no  real answers. Medical issues panic if alone worry unsteady. Peacefulness about it but also thinks about this happening at 40. Wants to do massage but have concerns about it. Loved the quiet and peace can't stand it now too much in mind.  Therapist thought a good question was what is a good metric what is reasonable to expect when out. Takes medication when something worrisome doesn't like feeling trapped go for example go to office. Patient said used to be independent was fine now person who has to depend on others taking away independence. In a peaceful place days sad because of not having the same amount of independence. Always something going on even when younger battle through had all the pain have maybe it is a little worse, grief passing away brought up to a head never lost somebody so important in life panic ensued now feel can't be alone used to drive go alone but patient does thinks symptoms are worse. Compartmentalize things dealing better with ex three years in July felt shove it aside was in a deep hole couldn't do anything. This happened feel like a crazy person, agoraphobic life better but how to deal with person dying? Have to push aside when think horrible and sad. Stronger person. Skin tougher. Believe something is there way to see people love again. Great dreams feels better, meditate and felt connected to him. Insight Timer-reconnecting with loved ones takes you a journey imagination take you there. Thinks need to look that and Shadow Journal. Look into EMDR.  Therapist noted that shadow journal will have her look more into repressed things and that may be good for her to help with her healing.  Explored with patient how much of this is medical and how much that feeds into the cycle then what the expectation for herself would be may be she does need some more support in public.  Continue to explore medical solutions the med which is a beta-blocker very helpful for anxiety and her heart rate, not  so simple solutions for neck, dizziness migraines although she started to see connection something that happens before hand so she is going to talk to her doctor about taking the medication earlier and see if that helps.  Therapist also mention addressing the component of anxiety that overthinking is usually the effect of anxiety not the cause so to continue to work on strategies to help improve symptoms.  Will have patient look at books stop overthinking as well as looking at traumatic aspects look at shadow journal having her integrate shadows, patient interested in EMDR therapist noted she things can be very helpful will look into it more.  Was also patient processing her coping with managing symptoms where she is lost independence the difficulty of that more days she does not feel well therapist assesses the processing of her feelings is helpful in itself, as we continue to look at a perspective to help her with improvement in symptoms.  Therapist also reviewed treatment plan patient gave consent to complete virtually.      Suicidal/Homicidal: No  Plan: Return again in 9 weeks.2.  Can look into book "stop overthinking", "shadow work journal" into EMDR therapist continue to work with patient on improvement in anxiety  Diagnosis: Panic with agoraphobia, generalized anxiety disorder  Collaboration of Care: Medication Management AEB you have Dr. Gilmore Laroche note  Patient/Guardian was advised Release of Information must be obtained prior to any record release in order to collaborate their care with an outside provider. Patient/Guardian was advised if they have not already done so to contact the registration department to sign all necessary forms in order for [place] to release information regarding their care.   Consent: Patient/Guardian gives verbal consent for treatment and assignment of benefits for services provided during this visit. Patient/Guardian expressed understanding and agreed to proceed.   Coolidge Breeze, LCSW 09/30/2022

## 2022-10-12 ENCOUNTER — Telehealth (HOSPITAL_COMMUNITY): Payer: Self-pay | Admitting: *Deleted

## 2022-10-12 MED ORDER — ALPRAZOLAM 0.5 MG PO TABS: ORAL_TABLET | ORAL | 1 refills | Status: DC

## 2022-10-12 NOTE — Telephone Encounter (Signed)
LVM Rx SENT

## 2022-10-12 NOTE — Addendum Note (Signed)
Addended by: Thresa Ross on: 10/12/2022 09:30 AM   Modules accepted: Orders

## 2022-10-12 NOTE — Telephone Encounter (Signed)
[id number] - [place], [place] - [address]   ALPRAZolam (XANAX) 0.5 MG tablet [[id number]]   Order Details  Dose, Route, Frequency: As Directed  Dispense Quantity: 30 tablet Refills: 1   Note to Pharmacy: Delete prior refills and delete buspar  Indications of Use: Anxiety, Panic Disorder       Sig: TAKE 1 TABLET BY MOUTH EVERY DAY AS NEEDED FOR ANXIETY       Last appt --08/24/22 Next appt -- 12/21/22

## 2022-12-02 ENCOUNTER — Ambulatory Visit (INDEPENDENT_AMBULATORY_CARE_PROVIDER_SITE_OTHER): Payer: 59 | Admitting: Licensed Clinical Social Worker

## 2022-12-02 DIAGNOSIS — F4001 Agoraphobia with panic disorder: Secondary | ICD-10-CM

## 2022-12-02 DIAGNOSIS — F411 Generalized anxiety disorder: Secondary | ICD-10-CM | POA: Diagnosis not present
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
## 2022-12-09 ENCOUNTER — Telehealth (HOSPITAL_COMMUNITY): Payer: Self-pay | Admitting: *Deleted

## 2022-12-09 MED ORDER — ALPRAZOLAM 0.5 MG PO TABS: ORAL_TABLET | ORAL | 1 refills | Status: DC

## 2022-12-09 NOTE — Telephone Encounter (Signed)
Patient Refill Request--  ALPRAZolam (XANAX) 0.5 MG tablet             Dispense: 30 tablet Pharmacy: [id number] [place], [place] - [address]     NEXT APPT  12/21/22 LAST APPT  08/24/22

## 2022-12-09 NOTE — Addendum Note (Signed)
Addended by: Thresa Ross on: 12/09/2022 08:30 AM   Modules accepted: Orders

## 2022-12-21 ENCOUNTER — Telehealth (HOSPITAL_COMMUNITY): Payer: 59 | Admitting: Psychiatry

## 2023-01-04 ENCOUNTER — Telehealth (HOSPITAL_COMMUNITY): Payer: 59 | Admitting: Psychiatry

## 2023-01-06 ENCOUNTER — Telehealth (HOSPITAL_COMMUNITY): Payer: 59 | Admitting: Psychiatry

## 2023-01-13 ENCOUNTER — Ambulatory Visit (HOSPITAL_COMMUNITY): Payer: 59 | Admitting: Licensed Clinical Social Worker

## 2023-01-15 ENCOUNTER — Telehealth (HOSPITAL_COMMUNITY): Payer: 59 | Admitting: Psychiatry

## 2023-01-15 ENCOUNTER — Encounter (HOSPITAL_COMMUNITY): Payer: Self-pay | Admitting: Psychiatry

## 2023-01-15 DIAGNOSIS — F411 Generalized anxiety disorder: Secondary | ICD-10-CM | POA: Diagnosis not present

## 2023-01-15 DIAGNOSIS — F32A Depression, unspecified: Secondary | ICD-10-CM | POA: Diagnosis not present

## 2023-01-15 DIAGNOSIS — F4001 Agoraphobia with panic disorder: Secondary | ICD-10-CM | POA: Diagnosis not present

## 2023-01-15 MED ORDER — ALPRAZOLAM 0.5 MG PO TABS: ORAL_TABLET | ORAL | 1 refills | Status: DC

## 2023-01-15 NOTE — Progress Notes (Signed)
[facility] Follow up visit   Patient Identification: Crystal Alexander MRN:  [id number] Date of Evaluation:  01/15/2023 Referral Source: primary care Chief Complaint: follow up anxiety, panic attacks Visit Diagnosis:    ICD-10-CM   1. GAD (generalized anxiety disorder)  F41.1     2. Panic disorder with agoraphobia  F40.01      Virtual Visit via Video Note  I connected with Crystal Alexander on 01/15/23 at 12:00 PM EDT by a video enabled telemedicine application and verified that I am speaking with the correct person using two identifiers.  Location: Patient: home Provider: home office   I discussed the limitations of evaluation and management by telemedicine and the availability of in person appointments. The patient expressed understanding and agreed to proceed.     I discussed the assessment and treatment plan with the patient. The patient was provided an opportunity to ask questions and all were answered. The patient agreed with the plan and demonstrated an understanding of the instructions.   The patient was advised to call back or seek an in-person evaluation if the symptoms worsen or if the condition fails to improve as anticipated.  I provided 18 minutes of non-face-to-face time during this encounter.      History of Present Illness: Patient is a 42  years old married Caucasian female she works as a Teacher, adult education.  Her parents live in the same street she takes care of her mom who has parkinsonism.  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  Consequences of Substance Abuse: NA  Past Medical History:  Past Medical History:  Diagnosis Date   Abnormal Pap smear of cervix    3/99 cin2 &3   Asthma    Fibroadenoma of breast     breast biopsy x 2   HPV (human papilloma virus) infection    Meniere's disease     Past Surgical History:  Procedure Laterality Date   CERVICAL BIOPSY  W/ LOOP ELECTRODE EXCISION  1999   negative margins   LEEP      Family Psychiatric History: mom: anxiety  Family History:  Family History  Problem Relation Age of Onset   Hypertension Mother    Parkinson's disease Mother    Diabetes Father    Hypertension Paternal Grandmother     Social History:   Social History   Socioeconomic History   Marital status: Married    Spouse name: Not on file   Number of children: Not on file   Years of education: Not on file   Highest education level: Not on file  Occupational History   Not on file  Tobacco Use   Smoking status: Former    Types: Cigarettes   Smokeless tobacco: Never  Substance and Sexual Activity   Alcohol use: Yes    Alcohol/week: 20.0 standard drinks of alcohol    Types: 20 Cans of beer per week    Comment:  in a month   Drug use: No   Sexual activity: Yes    Partners: Female    Birth control/protection: None    Comment: Female partner  Other Topics Concern   Not on file  Social History Narrative   Not on file   Social Determinants of Health   Financial Resource Strain: Low Risk  (11/30/2022)   Received from [facility]   Overall Financial Resource Strain (CARDIA)    Difficulty of Paying Living Expenses: Not hard at all  Food Insecurity: No Food Insecurity (11/30/2022)   Received from [facility]   Hunger Vital Sign    Worried About Running Out of Food in the Last Year: Never true    Ran Out of Food in the Last Year: Never  true  Transportation Needs: No Transportation Needs (11/30/2022)   Received from [facility] - Transportation    Lack of Transportation (Medical): No    Lack of Transportation (Non-Medical): No  Physical Activity: Insufficiently Active (11/30/2022)   [facility]   Exercise Vital Sign    Days of Exercise per Week: 4 days    Minutes of Exercise per Session: 20 min  Stress: Stress Concern Present (11/30/2022)   Received from [facility] - Occupational Stress Questionnaire    Feeling of Stress : To some extent  Social Connections: Socially Integrated (11/30/2022)   Received from [facility]   Social Network    How would you rate your social network (family, work, friends)?: Good participation with social networks     Allergies:  No Known Allergies  Metabolic Disorder Labs: Lab Results  Component Value Date   HGBA1C 5.3 12/26/2018   MPG 108 08/21/2015   Lab Results  Component Value Date   PROLACTIN 13.1 08/07/2015   Lab Results  Component Value Date   CHOL 225 (H) 12/26/2018   TRIG 223 (H) 12/26/2018   HDL 43 12/26/2018   CHOLHDL 5.2 (H) 12/26/2018   VLDL 28 08/07/2015   LDLCALC 142 (H) 12/26/2018   LDLCALC 153 (H) 08/07/2015   Lab Results  Component Value Date   TSH 3.240 12/26/2018    Therapeutic Level Labs: No results found for: "LITHIUM" No results found for: "CBMZ" No results found for: "VALPROATE"  Current Medications: Current Outpatient Medications  Medication Sig Dispense Refill   ALPRAZolam (XANAX) 0.5 MG tablet TAKE 1 TABLET BY MOUTH EVERY DAY AS NEEDED FOR ANXIETY 30 tablet 1   famotidine (PEPCID) 20 MG tablet Take 20 mg by mouth at bedtime.     fluticasone (FLOVENT HFA) 110 MCG/ACT inhaler Inhale 1 puff into the lungs in the morning and at bedtime.     metoprolol succinate (TOPROL-XL) 25 MG 24 hr tablet Take 25 mg by mouth daily.     rosuvastatin (CRESTOR) 10 MG tablet Take 10 mg  by mouth at bedtime.     No current facility-administered medications for this visit.      Psychiatric Specialty Exam: Review of Systems  Cardiovascular:  Negative for chest pain.  Psychiatric/Behavioral:  Negative for agitation, hallucinations and self-injury.     There were no vitals taken for this visit.There is no height or weight on file to calculate BMI.  General Appearance: Casual  Eye Contact:  Fair  Speech:  Clear and Coherent  Volume:  Normal  Mood: fair  Affect:  Full Range  Thought Process:  Goal Directed  Orientation:  Full (Time, Place, and  Person)  Thought Content:  Rumination  Suicidal Thoughts:  No  Homicidal Thoughts:  No  Memory:  Immediate;   Fair Recent;   Fair  Judgement:  Fair  Insight:  Fair  Psychomotor Activity:  Normal  Concentration:  Concentration: Fair and Attention Span: Fair  Recall:  Good  Fund of Knowledge:Good  Language: Good  Akathisia:  No  Handed:   AIMS (if indicated):  not done  Assets:  Communication Skills Desire for Improvement Financial Resources/Insurance Social Support  ADL's:  Intact  Cognition: WNL  Sleep:   variable   Screenings: Insurance account manager from 08/21/2020 in [facility] Video Visit from 07/12/2020 in [facility] at [facility]  PHQ-2 Total Score 2 0  PHQ-9 Total Score 7 --      Flowsheet Row Video Visit from 02/05/2022 in [facility] Video Visit from 11/06/2021 in [facility] at [facility] Video Visit from 08/13/2021 in [facility] at [facility]  C-SSRS RISK CATEGORY No Risk No Risk No Risk       Assessment and Plan: as follows \ Prior documentation reviewed   Panic disorder with agoraphobia;some improvement but still struggles , continue therapy and xanax,  next small step to work on against phobia or to go out on her own   generalized anxiety disorder; excessive or dwells on it, then re focus on current improvement. Continue therapy and xanax Depression : fair not worse, continue therapy  Fu3 renewed meds which were due Thresa Ross, MD 10/11/202412:08 PM

## 2023-02-18 ENCOUNTER — Ambulatory Visit (HOSPITAL_COMMUNITY): Payer: 59 | Admitting: Licensed Clinical Social Worker

## 2023-02-18 DIAGNOSIS — F411 Generalized anxiety disorder: Secondary | ICD-10-CM

## 2023-02-18 DIAGNOSIS — F4001 Agoraphobia with panic disorder: Secondary | ICD-10-CM

## 2023-02-18 NOTE — Progress Notes (Signed)
Virtual Visit via Video Note  I connected with Crystal Alexander on 02/18/23 at  4:00 PM EST by a video enabled telemedicine application and verified that I am speaking with the correct person using two identifiers.  Location: Patient: home Provider: home office   I discussed the limitations of evaluation and management by telemedicine and the availability of in person appointments. The patient expressed understanding and agreed to proceed.   I discussed the assessment and treatment plan with the patient. The patient was provided an opportunity to ask questions and all were answered. The patient agreed with the plan and demonstrated an understanding of the instructions.   The patient was advised to call back or seek an in-person evaluation if the symptoms worsen or if the condition fails to improve as anticipated.  I provided 52 minutes of non-face-to-face time during this encounter.  THERAPIST PROGRESS NOTE  Session Time: 4:00 PM to 4:52 PM  Participation Level: Active  Behavioral Response: CasualAlertappropriate  Type of Therapy: Individual Therapy  Treatment Goals addressed:  patient wants to feel at peace and feel normal again, reduce anxiety, panic, coping, work on grief, note impacts her anxiety, its first needs to work on panic in public before addressing driving issues  ProgressTowards Goals: Progressing-continue to work on Pharmacologist for anxiety as patient notes helps to check in helps to be reminded of goals and be motivating for her to keep working on goals noted positives patient exercising and also hopes for weight loss with new medication  Interventions: Solution Focused, Strength-based, Supportive, and Other: coping  Summary: Crystal Alexander is a 42 y.o. female who presents with migraines two weeks not sure if weather. Reviewed patient has chronic medical issues have to deal with. Always had pain pushed through it when anxiety came along, gained weight all that made  it worse, getting older. Started medicine GLP1 hopefully to lose weight. compounded medicine. Diabetic medication weight loss version would cost $800. Insurance not cover but found a way to get through a program. Have been working out a little while. 15 minutes on treadmill in the morning. 15 more minutes after get off of work. The medication is helping with snacking. She was constantly think of food and anxiety, stress not helping. Not thinking of it today a positive development..  Talked about exposure trying to do but slowly.  Grief still going through it.  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 Patient has the question why can't do this?. Therapist and patient talked about limits that we reach where it gets hard to walk through fear. Patient shares why unable to go back to massage therapy used to look forward to calm and peace but with anxious mind can't be left alone, anxiety build talk herself into panic, fleeting thought mind grabs and holds onto.  Driving still want to do that she does drive to mom's live on the same street. [place] does go there.  Therapist noted these are achievements.  Patient says what is good about therapy doesn't feel nervous. Is with other doctor appointments. Comfortable and feel better. She describes herself as extroverted introvert happy and bubbly. Massage can be exhausting talking when something going on.  We will look at shadow workbook next week we talked about affairs in the world that are stressful and how we can cope both of [place] validating in terms of the stress.  Helpful to have a community turn to and patient shared some  of the things she looks on Instagram.  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  Reviewed session  patient says helps to check again helps to remind her of her goals helps her to motivate about goals.  Suicidal/Homicidal: No  Plan: Return again in 2 months. 2.  Review current stressors in world, stressors in patient's own life look at shadow book processed thoughts and feelings to help with coping  Diagnosis: Panic with agoraphobia, generalized anxiety disorder  Collaboration of Care: Medication Management AEB review of Dr. Gilmore Laroche note  Patient/Guardian was advised Release of Information must be obtained prior to any record release in order to collaborate their care with an outside provider. Patient/Guardian was advised if they have not already done so to contact the registration department to sign all necessary forms in order for [place] to release information regarding their care.   Consent: Patient/Guardian gives verbal consent for treatment and assignment of benefits for services provided during this visit. Patient/Guardian expressed understanding and agreed to proceed.   Coolidge Breeze, LCSW 02/18/2023

## 2023-04-12 ENCOUNTER — Telehealth (HOSPITAL_COMMUNITY): Payer: Self-pay | Admitting: Psychiatry

## 2023-04-12 MED ORDER — ALPRAZOLAM 0.5 MG PO TABS: ORAL_TABLET | ORAL | 1 refills | Status: DC

## 2023-04-12 NOTE — Telephone Encounter (Signed)
 Pt calling.  Needs refill on xanax  [facility] [address]  Next Visit -1/20 Last Visit -10/11

## 2023-04-21 ENCOUNTER — Telehealth (HOSPITAL_COMMUNITY): Payer: 59 | Admitting: Psychiatry

## 2023-04-26 ENCOUNTER — Telehealth (INDEPENDENT_AMBULATORY_CARE_PROVIDER_SITE_OTHER): Payer: 59 | Admitting: Psychiatry

## 2023-04-26 ENCOUNTER — Encounter (HOSPITAL_COMMUNITY): Payer: Self-pay | Admitting: Psychiatry

## 2023-04-26 DIAGNOSIS — F4001 Agoraphobia with panic disorder: Secondary | ICD-10-CM | POA: Diagnosis not present

## 2023-04-26 DIAGNOSIS — F411 Generalized anxiety disorder: Secondary | ICD-10-CM | POA: Diagnosis not present

## 2023-04-26 NOTE — Progress Notes (Signed)
[facility] Follow up visit   Patient Identification: Crystal Alexander MRN:  [id number] Date of Evaluation:  04/26/2023 Referral Source: primary care Chief Complaint: follow up anxiety, panic attacks Visit Diagnosis:    ICD-10-CM   1. GAD (generalized anxiety disorder)  F41.1     2. Panic disorder with agoraphobia  F40.01     Virtual Visit via Video Note  I connected with Crystal Alexander on 04/26/23 at  4:00 PM EST by a video enabled telemedicine application and verified that I am speaking with the correct person using two identifiers.  Location: Patient: home Provider: home office   I discussed the limitations of evaluation and management by telemedicine and the availability of in person appointments. The patient expressed understanding and agreed to proceed.     I discussed the assessment and treatment plan with the patient. The patient was provided an opportunity to ask questions and all were answered. The patient agreed with the plan and demonstrated an understanding of the instructions.   The patient was advised to call back or seek an in-person evaluation if the symptoms worsen or if the condition fails to improve as anticipated.  I provided 18 minutes of non-face-to-face time during this encounter.       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  Her parents live in the same street she takes care of her mom who has parkinsonism.  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  Consequences of Substance Abuse: NA  Past Medical History:  Past Medical History:  Diagnosis Date   Abnormal Pap smear of cervix    3/99 cin2 &3   Asthma    Fibroadenoma of breast     breast biopsy x 2   HPV (human papilloma virus) infection    Meniere's disease     Past Surgical History:  Procedure Laterality Date   CERVICAL BIOPSY  W/ LOOP ELECTRODE EXCISION  1999   negative margins   LEEP      Family Psychiatric History: mom: anxiety  Family History:  Family History  Problem Relation Age of Onset   Hypertension Mother    Parkinson's disease Mother    Diabetes Father    Hypertension Paternal Grandmother     Social History:   Social History   Socioeconomic History   Marital status: Married    Spouse name: Not on file   Number of children: Not on file   Years of education: Not on file   Highest education level: Not on file  Occupational History   Not on file  Tobacco Use   Smoking status: Former    Types: Cigarettes   Smokeless tobacco: Never  Substance and Sexual Activity   Alcohol use: Yes    Alcohol/week: 20.0 standard drinks of alcohol    Types:  20 Cans of beer per week    Comment: in a month   Drug use: No   Sexual activity: Yes    Partners: Female    Birth control/protection: None    Comment: Female partner  Other Topics Concern   Not on file  Social History Narrative   Not on file   Social Drivers of Health   Financial Resource Strain: Low Risk  (11/30/2022)   Received from [facility]   Overall Financial Resource Strain (CARDIA)    Difficulty of Paying Living Expenses: Not hard at all  Food Insecurity: No Food Insecurity (11/30/2022)   Received from [facility]   Hunger Vital Sign    Worried About Running Out of Food in the Last Year: Never true     Ran Out of Food in the Last Year: Never true  Transportation Needs: No Transportation Needs (11/30/2022)   Received from [facility] - Transportation    Lack of Transportation (Medical): No    Lack of Transportation (Non-Medical): No  Physical Activity: Insufficiently Active (11/30/2022)   Received from [facility]   Exercise Vital Sign    Days of Exercise per Week: 4 days    Minutes of Exercise per Session: 20 min  Stress: Stress Concern Present (11/30/2022)   Received from [facility] - Occupational Stress Questionnaire    Feeling of Stress : To some extent  Social Connections: Socially Integrated (11/30/2022)   Received from [facility]   Social Network    How would you rate your social network (family, work, friends)?: Good participation with social networks     Allergies:  No Known Allergies  Metabolic Disorder Labs: Lab Results  Component Value Date   HGBA1C 5.3 12/26/2018   MPG 108 08/21/2015   Lab Results  Component Value Date   PROLACTIN 13.1 08/07/2015   Lab Results  Component Value Date   CHOL 225 (H) 12/26/2018   TRIG 223 (H) 12/26/2018   HDL 43 12/26/2018   CHOLHDL 5.2 (H) 12/26/2018   VLDL 28 08/07/2015   LDLCALC 142 (H) 12/26/2018   LDLCALC 153 (H) 08/07/2015   Lab Results  Component Value Date   TSH 3.240 12/26/2018    Therapeutic Level Labs: No results found for: "LITHIUM" No results found for: "CBMZ" No results found for: "VALPROATE"  Current Medications: Current Outpatient Medications  Medication Sig Dispense Refill   ALPRAZolam (XANAX) 0.5 MG tablet TAKE 1 TABLET BY MOUTH EVERY DAY AS NEEDED FOR ANXIETY 30 tablet 1   famotidine (PEPCID) 20 MG tablet Take 20 mg by mouth at bedtime.     fluticasone (FLOVENT HFA) 110 MCG/ACT inhaler Inhale 1 puff into the lungs in the morning and at bedtime.     metoprolol succinate (TOPROL-XL) 25 MG 24 hr tablet Take 25 mg by mouth daily.      rosuvastatin (CRESTOR) 10 MG tablet Take 10 mg by mouth at bedtime.     No current facility-administered medications for this visit.      Psychiatric Specialty Exam: Review of Systems  Cardiovascular:  Negative for chest pain.  Psychiatric/Behavioral:  Negative for agitation, hallucinations and self-injury.     There were no vitals taken for this visit.There is no height or weight on file to calculate BMI.  General Appearance: Casual  Eye Contact:  Fair  Speech:  Clear and Coherent  Volume:  Normal  Mood: fair  Affect:  Full Range  Thought Process:  Goal Directed  Orientation:  Full (Time, Place, and Person)  Thought Content:  Rumination  Suicidal Thoughts:  No  Homicidal Thoughts:  No  Memory:  Immediate;   Fair Recent;   Fair  Judgement:  Fair  Insight:  Fair  Psychomotor Activity:  Normal  Concentration:  Concentration: Fair and Attention Span: Fair  Recall:  Good  Fund of Knowledge:Good  Language: Good  Akathisia:  No  Handed:   AIMS (if indicated):  not done  Assets:  Communication Skills Desire for Improvement Financial Resources/Insurance Social Support  ADL's:  Intact  Cognition: WNL  Sleep:   variable   Screenings: Insurance account manager from 08/21/2020 in [facility] Video Visit from 07/12/2020 in [facility]  PHQ-2 Total Score 2 0  PHQ-9 Total Score 7 --      Flowsheet Row Video Visit from 02/05/2022 in [facility] at [facility] Video Visit from 11/06/2021 in [facility] at [facility] Video Visit from 08/13/2021 in [facility] at [facility]  C-SSRS RISK CATEGORY No Risk No Risk No Risk       Assessment and Plan: as follows \ Prior documentation reviewed   Panic disorder with agoraphobia; small improvements in  agarophobia, continue therapy and small dose of xanax take half or less a day    generalized anxiety disorder;gets stressed easily but some better with therapy and meds continue  Depression : fair not worse, continue therapy  Fu 73m. Has meds for now Thresa Ross, MD 1/20/20254:01 PM

## 2023-04-28 ENCOUNTER — Ambulatory Visit (HOSPITAL_COMMUNITY): Payer: 59 | Admitting: Licensed Clinical Social Worker

## 2023-05-18 ENCOUNTER — Telehealth (HOSPITAL_COMMUNITY): Payer: Self-pay | Admitting: *Deleted

## 2023-05-18 MED ORDER — ALPRAZOLAM 0.5 MG PO TABS: ORAL_TABLET | ORAL | 1 refills | Status: DC

## 2023-05-18 NOTE — Telephone Encounter (Signed)
Rx CHANGE  [facility] [address], [place] [address]    ALPRAZolam (XANAX) 0.5 MG tablet   NEXT APPT  08/24/23 LAST APPT   04/26/23

## 2023-05-18 NOTE — Addendum Note (Signed)
Addended by: Thresa Ross on: 05/18/2023 10:18 AM   Modules accepted: Orders

## 2023-05-26 ENCOUNTER — Ambulatory Visit (HOSPITAL_COMMUNITY): Payer: 59 | Admitting: Licensed Clinical Social Worker

## 2023-05-26 DIAGNOSIS — F411 Generalized anxiety disorder: Secondary | ICD-10-CM | POA: Diagnosis not present

## 2023-05-26 DIAGNOSIS — F4001 Agoraphobia with panic disorder: Secondary | ICD-10-CM
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
## 2023-07-28 ENCOUNTER — Ambulatory Visit (INDEPENDENT_AMBULATORY_CARE_PROVIDER_SITE_OTHER): Payer: 59 | Admitting: Licensed Clinical Social Worker

## 2023-07-28 DIAGNOSIS — F411 Generalized anxiety disorder: Secondary | ICD-10-CM | POA: Diagnosis not present

## 2023-07-28 DIAGNOSIS — F4001 Agoraphobia with panic disorder: Secondary | ICD-10-CM | POA: Diagnosis not present
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
## 2023-08-07 ENCOUNTER — Other Ambulatory Visit (HOSPITAL_COMMUNITY): Payer: Self-pay | Admitting: Psychiatry

## 2023-08-09 ENCOUNTER — Telehealth (HOSPITAL_COMMUNITY): Payer: Self-pay | Admitting: *Deleted

## 2023-08-09 NOTE — Telephone Encounter (Signed)
 PATIENT REQUEST ALPRAZolam  (XANAX ) 0.5 MG tablet

## 2023-08-09 NOTE — Telephone Encounter (Signed)
 NEXT APPT  08/24/23 LAST APPT   04/26/23

## 2023-08-23 ENCOUNTER — Telehealth (HOSPITAL_COMMUNITY): Payer: 59 | Admitting: Psychiatry

## 2023-08-24 ENCOUNTER — Encounter (HOSPITAL_COMMUNITY): Payer: Self-pay | Admitting: Psychiatry

## 2023-08-24 ENCOUNTER — Telehealth (INDEPENDENT_AMBULATORY_CARE_PROVIDER_SITE_OTHER): Payer: 59 | Admitting: Psychiatry

## 2023-08-24 DIAGNOSIS — F411 Generalized anxiety disorder: Secondary | ICD-10-CM | POA: Diagnosis not present

## 2023-08-24 DIAGNOSIS — F4001 Agoraphobia with panic disorder: Secondary | ICD-10-CM | POA: Diagnosis not present

## 2023-08-24 MED ORDER — FLUOXETINE HCL 10 MG PO CAPS: 10.0000 mg | ORAL_CAPSULE | Freq: Every day | ORAL | 0 refills | Status: DC

## 2023-08-24 NOTE — Progress Notes (Signed)
 [facility] Follow up visit   Patient Identification: Crystal Alexander MRN:  [id number] Date of Evaluation:  08/24/2023 Referral Source: primary care Chief Complaint: follow up anxiety, panic attacks Visit Diagnosis:    ICD-10-CM   1. GAD (generalized anxiety disorder)  F41.1     2. Panic disorder with agoraphobia  F40.01     Virtual Visit via Video Note  I connected with Crystal Alexander on 08/24/23 at  3:00 PM EDT by a video enabled telemedicine application and verified that I am speaking with the correct person using two identifiers.  Location: Patient: home Provider: home office   I discussed the limitations of evaluation and management by telemedicine and the availability of in person appointments. The patient expressed understanding and agreed to proceed.     I discussed the assessment and treatment plan with the patient. The patient was provided an opportunity to ask questions and all were answered. The patient agreed with the plan and demonstrated an understanding of the instructions.   The patient was advised to call back or seek an in-person evaluation if the symptoms worsen or if the condition fails to improve as anticipated.  I provided 18 minutes of non-face-to-face time during this encounter.    History of Present Illness: Patient is a 44  years old married Caucasian female she works as a Teacher, adult education.  Her parents live in the same street she takes care of her mom who has parkinsonism.  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  Consequences of Substance Abuse: NA  Past Medical History:  Past Medical History:  Diagnosis Date   Abnormal Pap smear of cervix    3/99 cin2 &3   Asthma    Fibroadenoma of breast     breast biopsy x 2   HPV (human papilloma virus) infection    Meniere's disease     Past Surgical History:  Procedure Laterality Date   CERVICAL BIOPSY  W/ LOOP ELECTRODE EXCISION  1999   negative margins   LEEP      Family Psychiatric History: mom: anxiety  Family History:  Family History  Problem Relation Age of Onset   Hypertension Mother    Parkinson's disease Mother    Diabetes Father    Hypertension Paternal Grandmother     Social History:   Social History   Socioeconomic History   Marital status: Married    Spouse name: Not on file   Number of children: Not on file   Years of education: Not on file   Highest education level: Not on file  Occupational History   Not on file  Tobacco Use   Smoking status: Former    Types: Cigarettes   Smokeless tobacco: Never  Substance and Sexual Activity   Alcohol use: Yes    Alcohol/week: 20.0 standard drinks  of alcohol    Types: 20 Cans of beer per week    Comment: in a month   Drug use: No   Sexual activity: Yes    Partners: Female    Birth control/protection: None    Comment: Female partner  Other Topics Concern   Not on file  Social History Narrative   Not on file   Social Drivers of Health   Financial Resource Strain: Low Risk  (05/21/2023)   Received from [facility]   Overall Financial Resource Strain (CARDIA)    Difficulty of Paying Living Expenses: Not hard at all  Food Insecurity: No Food Insecurity (05/21/2023)   Received from [facility]   Hunger Vital Sign     Worried About Running Out of Food in the Last Year: Never true    Ran Out of Food in the Last Year: Never true  Transportation Needs: No Transportation Needs (05/21/2023)   Received [facility] - [facility]    Lack of Transportation (Medical): No    Lack of Transportation (Non-Medical): No  Physical Activity: Insufficiently Active (11/30/2022)   Received from [facility]   Exercise Vital Sign    Days of Exercise per Week: 4 days    Minutes of Exercise per Session: 20 min  Stress: Stress Concern Present (11/30/2022)   Received from [facility] Occupational [facility] - Occupational Stress Questionnaire    Feeling of Stress : To some extent  Social Connections: Socially Integrated (11/30/2022)   Received from [facility]   Social Network    How would you rate your social network (family, work, friends)?: Good participation with social networks     Allergies:  No Known Allergies  Metabolic Disorder Labs: Lab Results  Component Value Date   HGBA1C 5.3 12/26/2018   MPG 108 08/21/2015   Lab Results  Component Value Date   PROLACTIN 13.1 08/07/2015   Lab Results  Component Value Date   CHOL 225 (H) 12/26/2018   TRIG 223 (H) 12/26/2018   HDL 43 12/26/2018   CHOLHDL 5.2 (H) 12/26/2018   VLDL 28 08/07/2015   LDLCALC 142 (H) 12/26/2018   LDLCALC 153 (H) 08/07/2015   Lab Results  Component Value Date   TSH 3.240 12/26/2018    Therapeutic Level Labs: No results found for: "LITHIUM" No results found for: "CBMZ" No results found for: "VALPROATE"  Current Medications: Current Outpatient Medications  Medication Sig Dispense Refill   FLUoxetine (PROZAC) 10 MG capsule Take 1 capsule (10 mg total) by mouth daily. 30 capsule 0   ALPRAZolam  (XANAX ) 0.5 MG tablet TAKE 1 TABLET BY MOUTH ONCE DAILY AS NEEDED FOR ANXIETY 30 tablet 0   famotidine (PEPCID) 20 MG tablet Take 20 mg by mouth at bedtime.     fluticasone (FLOVENT HFA) 110 MCG/ACT  inhaler Inhale 1 puff into the lungs in the morning and at bedtime.     metoprolol succinate (TOPROL-XL) 25 MG 24 hr tablet Take 25 mg by mouth daily.     rosuvastatin (CRESTOR) 10 MG tablet Take 10 mg by mouth at bedtime.     No current facility-administered medications for this visit.      Psychiatric Specialty Exam: Review of Systems  Cardiovascular:  Negative for chest pain.  Psychiatric/Behavioral:  Negative for agitation, hallucinations and self-injury. The patient is nervous/anxious.     There were no vitals taken for this visit.There is no height or weight on file to calculate BMI.  General Appearance: Casual  Eye Contact:  Fair  Speech:  Clear and Coherent  Volume:  Normal  Mood: gets anxious  Affect:  Full Range  Thought Process:  Goal Directed  Orientation:  Full (Time, Place, and Person)  Thought Content:  Rumination  Suicidal Thoughts:  No  Homicidal Thoughts:  No  Memory:  Immediate;   Fair Recent;   Fair  Judgement:  Fair  Insight:  Fair  Psychomotor Activity:  Normal  Concentration:  Concentration: Fair and Attention Span: Fair  Recall:  Good  Fund of Knowledge:Good  Language: Good  Akathisia:  No  Handed:   AIMS (if indicated):  not done  Assets:  Communication Skills Desire for Improvement Financial Resources/Insurance Social Support  ADL's:  Intact  Cognition: WNL  Sleep:  variable   Screenings: Insurance account manager from 08/21/2020 in [facility] at [facility] Video Visit from 07/12/2020 in [facility] at [facility]  PHQ-2 Total Score 2 0  PHQ-9 Total Score 7 --      Flowsheet Row Video Visit from 08/24/2023 in [facility] at [facility] Video Visit from 02/05/2022 in [facility] at [facility] Video Visit from 11/06/2021 in [facility] at [facility]  C-SSRS RISK CATEGORY No Risk No Risk No Risk       Assessment and Plan: as follows Prior documentation reviewed    Panic disorder with agoraphobia; sporadic but still has limits and not improving. Will add prozac, continue xanax  and therapy, also doing EMDR  generalized anxiety disorder; gets stressed, add prozac as above, reviewed side effects Depression : somewhat subdued but  not hopeless, more so trying to get better in regard to anxiety  Will add prozac  Increase if needed FU 1 m. But she wants to fu in 2 m. Call earlier If needed   Fu 65m. Has meds for now Wray Heady, MD 5/20/20253:11 PM

## 2023-09-09 ENCOUNTER — Other Ambulatory Visit (HOSPITAL_COMMUNITY): Payer: Self-pay | Admitting: Psychiatry

## 2023-09-10 ENCOUNTER — Telehealth (HOSPITAL_COMMUNITY): Payer: Self-pay | Admitting: *Deleted

## 2023-09-10 MED ORDER — ALPRAZOLAM 0.5 MG PO TABS: 0.5000 mg | ORAL_TABLET | Freq: Every day | ORAL | 1 refills | Status: DC | PRN

## 2023-09-10 NOTE — Telephone Encounter (Signed)
 Patient Request [facility] [address] [place], [place] - [address]   ALPRAZolam  (XANAX ) 0.5 MG tablet    NEXT APPT  09/29/23 LAST APPT   08/24/23

## 2023-09-10 NOTE — Addendum Note (Signed)
 Addended by: Wray Heady on: 09/10/2023 11:38 AM   Modules accepted: Orders

## 2023-09-13 ENCOUNTER — Telehealth (HOSPITAL_COMMUNITY): Payer: Self-pay | Admitting: *Deleted

## 2023-09-13 NOTE — Telephone Encounter (Signed)
 PATIENT REQUEST 0-REFILLS [facility] [address] [place], [place] - [address]   ALPRAZolam  (XANAX ) 0.5 MG tablet   NEXT APPT  10/18/23 LAST APPT   09/2523

## 2023-09-21 IMAGING — RF DG ESOPHAGUS
11 of 13 series · 14 of 24 positions shown · non-contrast
Comparison: NONE.

CLINICAL DATA: Sensation of foods becoming stuck in lower
esophagus, reports heartburn

EXAM:
ESOPHAGUS/BARIUM SWALLOW/TABLET STUDY
TECHNIQUE: Combined double and single contrast examination was performed using
effervescent crystals, high-density barium, thin liquid barium and
1/2 inch barium tablet. This exam was performed by Tornes, Odd-Martin, and was supervised and interpreted by Bijeshkumar, Rksras.
FLUOROSCOPY:
Radiation Exposure Index (as provided by the fluoroscopic device):
50.560 mGy

[Series 1: sequence · 2 of 8 frames shown (1 of 10)]
[frame 2/8]
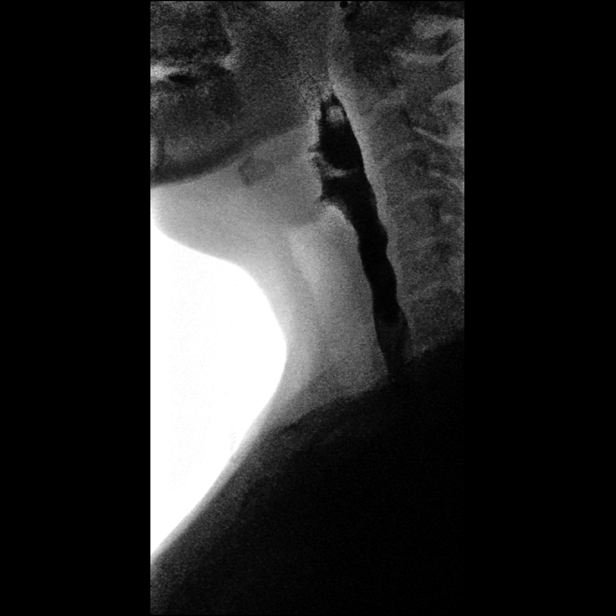
[frame 7/8]
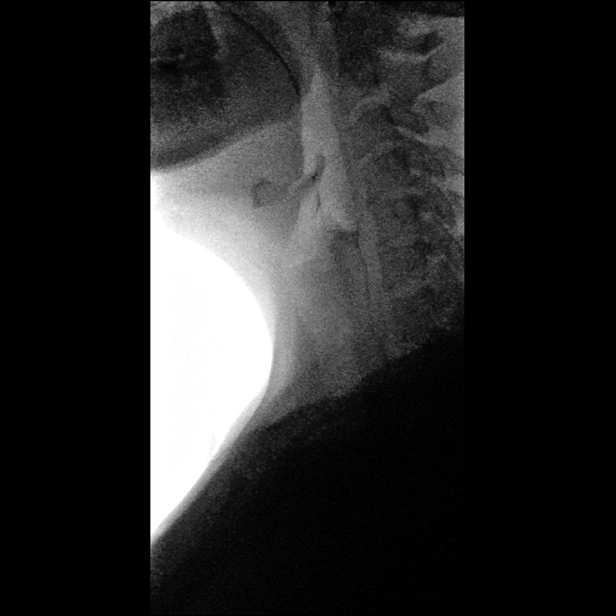

[Series 2: sequence · 0.31mm/px · 1 of 2 frames shown (2 of 10)]
[frame 2/2]
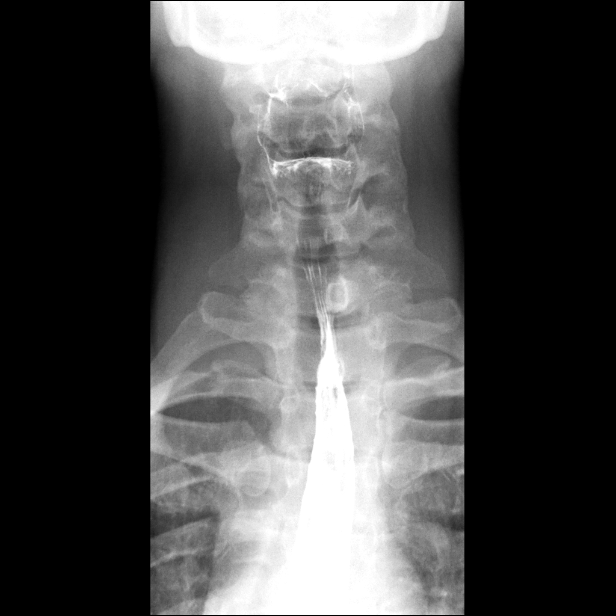

[Series 5: sequence · 0.31mm/px · 1 of 1 slices shown (3 of 10)]
[im 1/1]
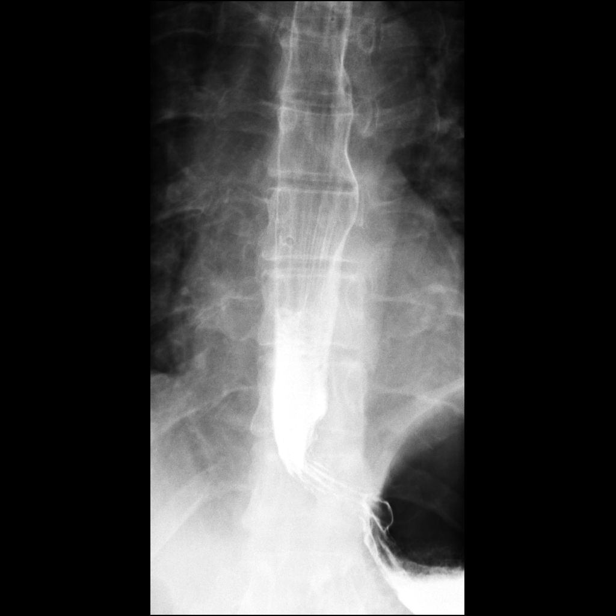

[Series 6: sequence · 0.31mm/px · 1 of 2 frames shown (4 of 10)]
[frame 1/2]
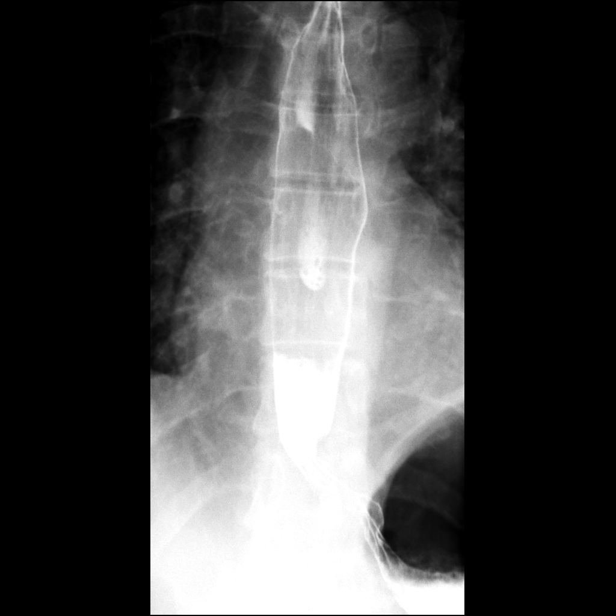

[Series 7: sequence · 0.31mm/px · 1 of 3 frames shown (5 of 10)]
[frame 2/3]
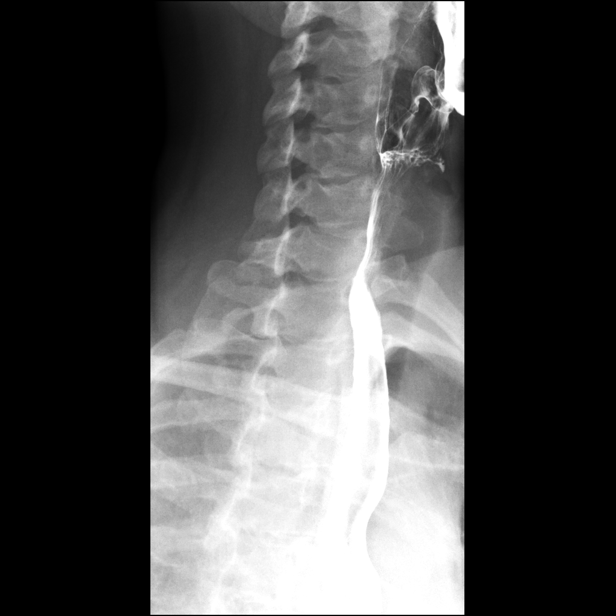

[Series 8: sequence · 0.31mm/px · 1 of 1 slices shown (6 of 10)]
[im 1/1]
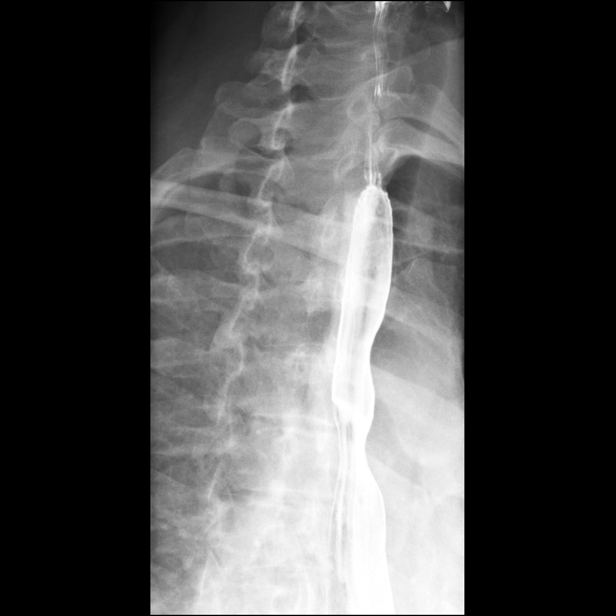

[Series 10: sequence · 0.31mm/px · 1 of 1 slices shown (7 of 10)]
[im 1/1]
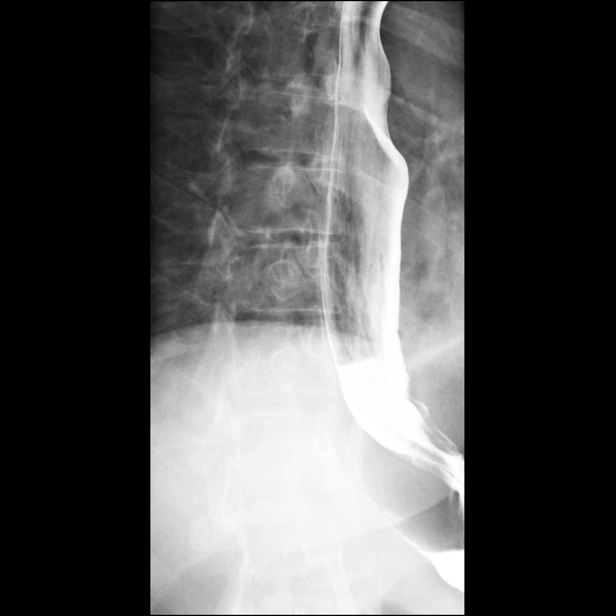

[Series 12: sequence · 0.31mm/px · 1 of 1 slices shown (8 of 10)]
[im 1/1]
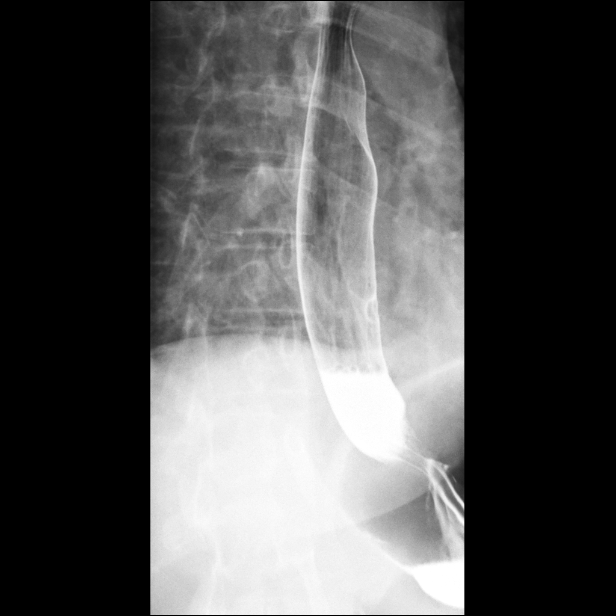

[Series 14: sequence · 2 of 60 frames shown (9 of 10)]
[frame 21/60]
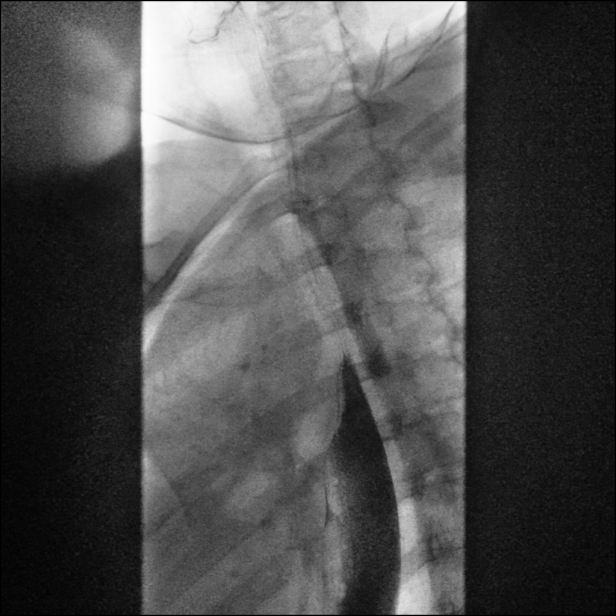
[frame 52/60]
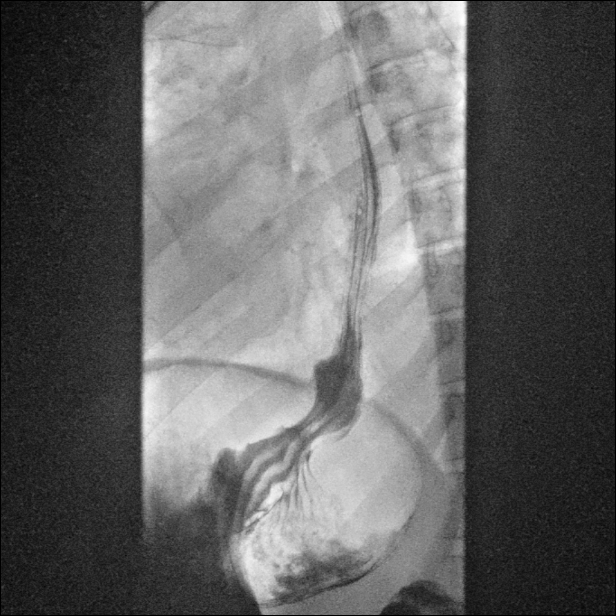

[Series 15: one shot · 1 of 3 slices shown]
[im 2/3]
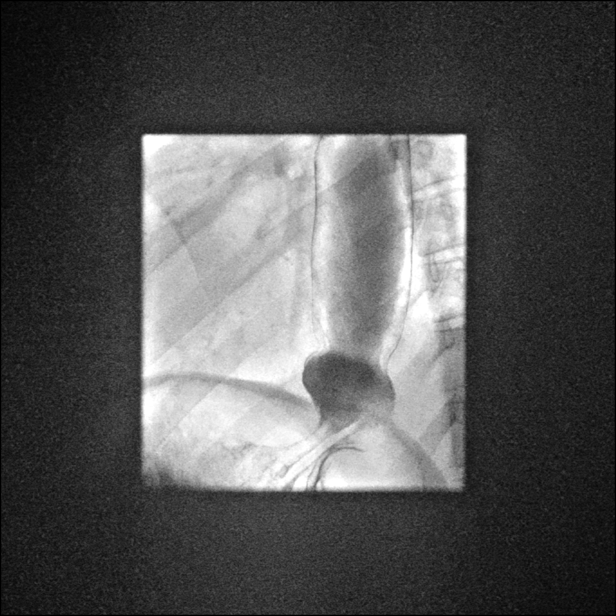

[Series 16: sequence · 2 of 73 frames shown (10 of 10)]
[frame 11/73]
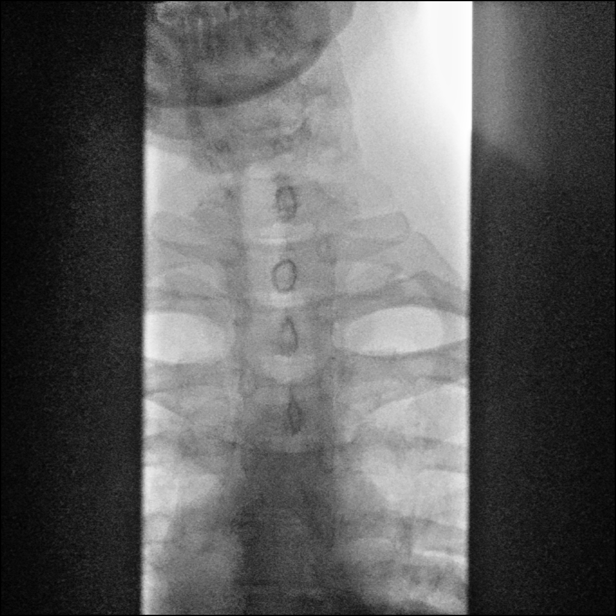
[frame 67/73]
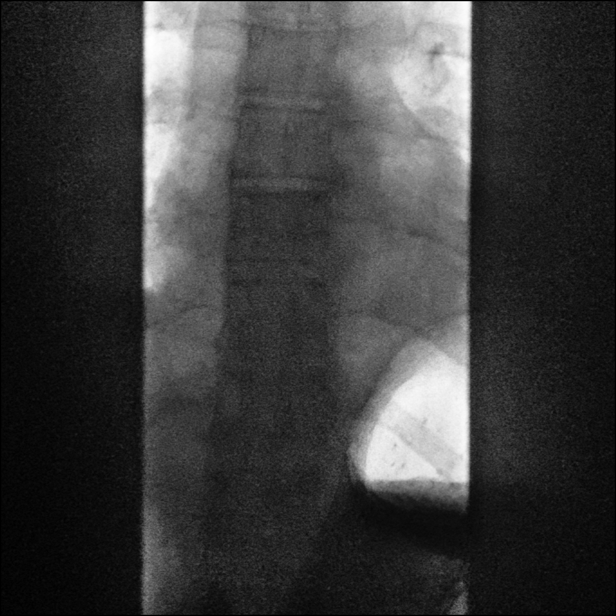

[14 of 24 positions shown; findings below may reference images not displayed]

FINDINGS: Swallowing: Appears normal. No vestibular penetration or aspiration
seen.

Pharynx: Unremarkable.

Esophagus: Mildly patulous. Unremarkable course. No mass or
stricture.

Esophageal motility: Weak stripping wave with esophageal stasis
present. No tertiary contractions

Hiatal Hernia: Small sliding-type hiatal hernia

Gastroesophageal reflux: None definitively visualized secondary to
esophageal stasis.

Ingested 13mm barium tablet: Passed normally

Other: None.
IMPRESSION: 1.  Mildly patulous esophagus with small sliding-type hiatal hernia.

2. Mild esophageal dysmotility. No evidence of stricture, mass or
ulceration.

## 2023-09-23 IMAGING — CT CT CHEST HIGH RESOLUTION
1 of 4 series · 15 of 32 positions shown, 19 images · non-contrast
Comparison: None.

CLINICAL DATA: History of connective tissue disease



[Series 9: super d · axial · 0.69mm/px · z∈[+163,+428]mm · 15 of 496 slices shown, 19 images]
[im 27/496  mediastinal]
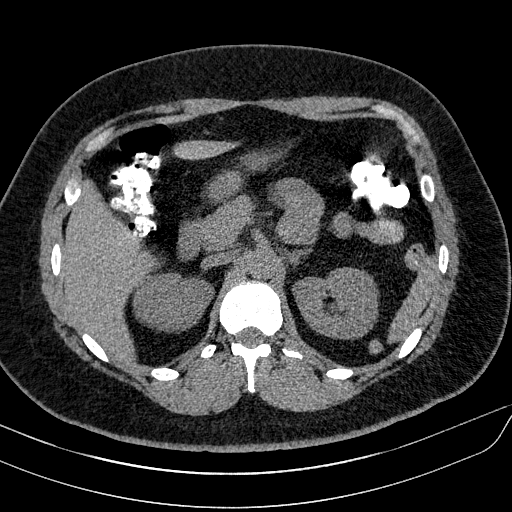
[im 27/496  lung]
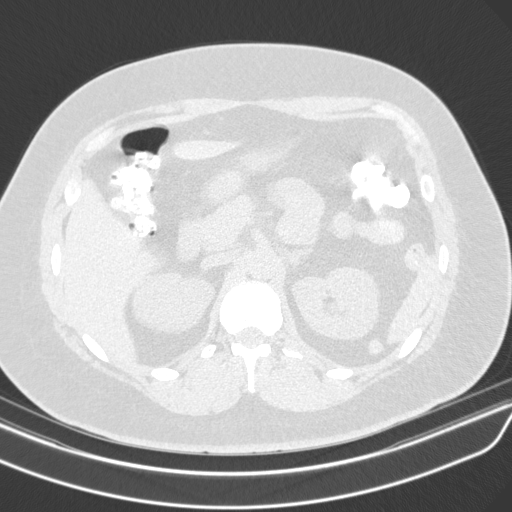
[im 79/496  lung]
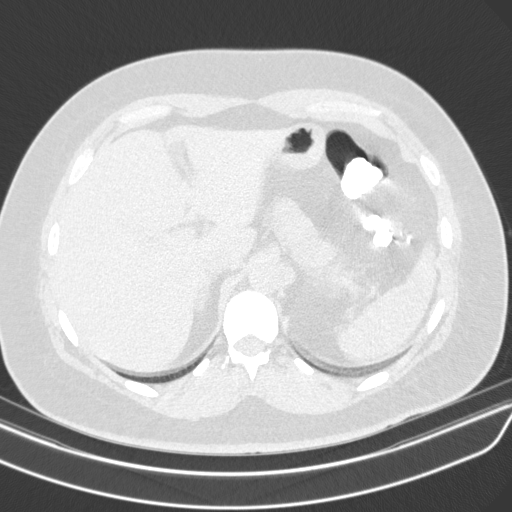
[im 105/496  lung]
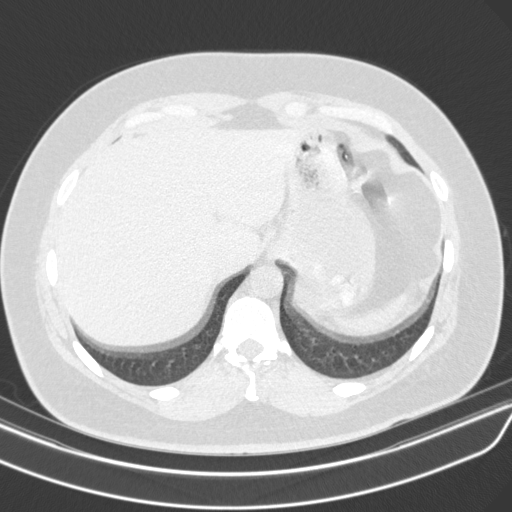
[im 131/496  lung]
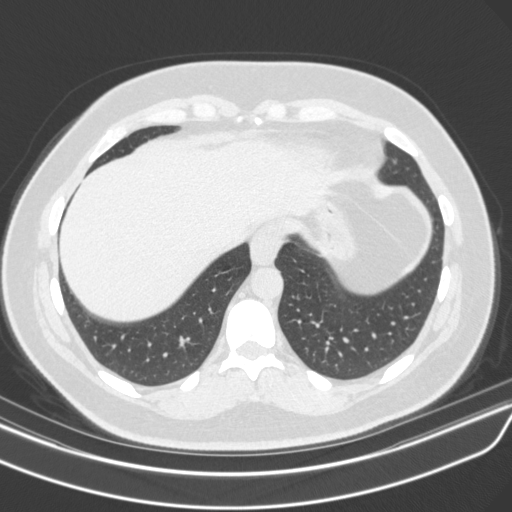
[im 183/496  mediastinal]
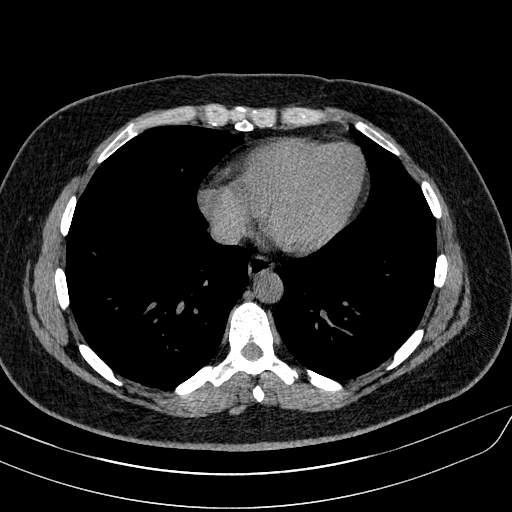
[im 183/496  lung]
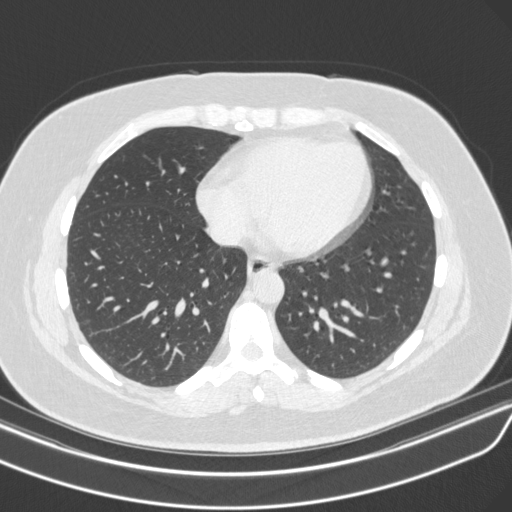
[im 209/496  lung]
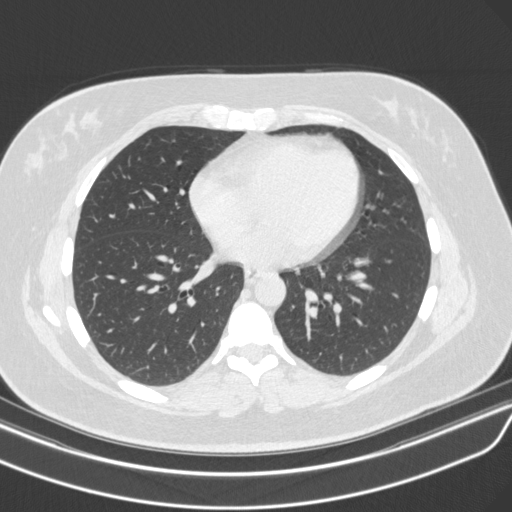
[im 232/496  lung]
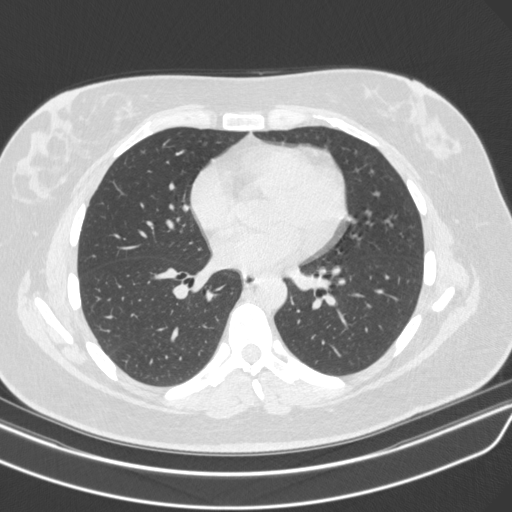
[im 248/496  lung]
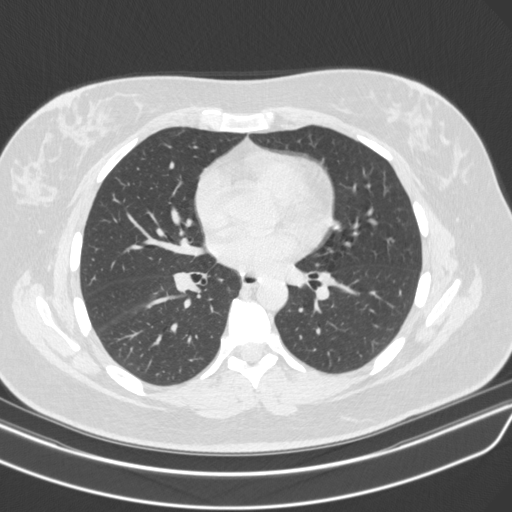
[im 261/496  mediastinal]
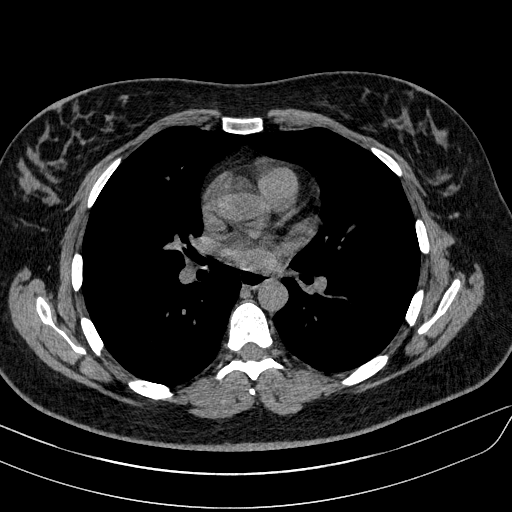
[im 261/496  lung]
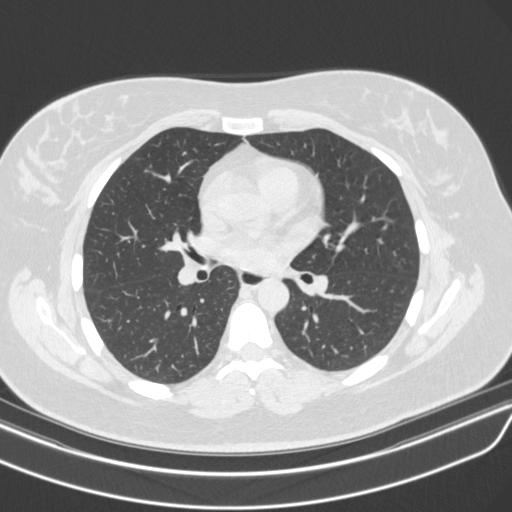
[im 287/496  lung]
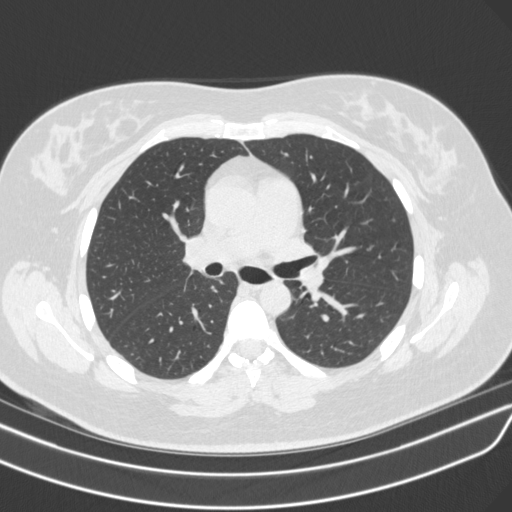
[im 339/496  lung]
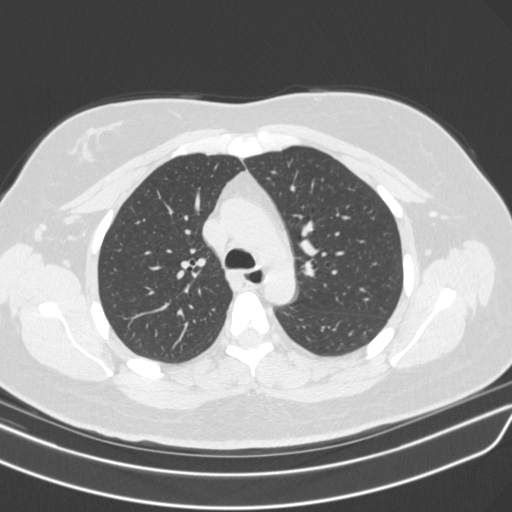
[im 365/496  lung]
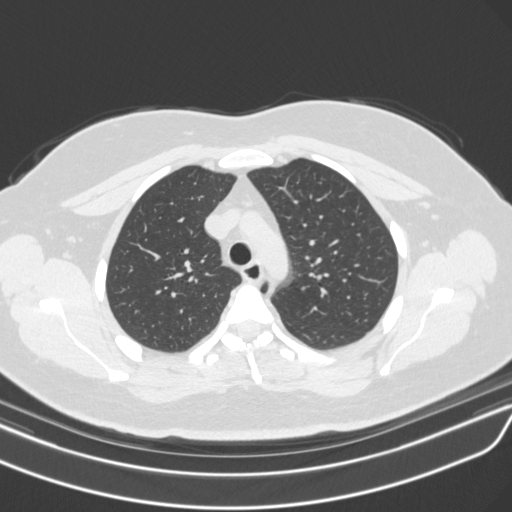
[im 391/496  mediastinal]
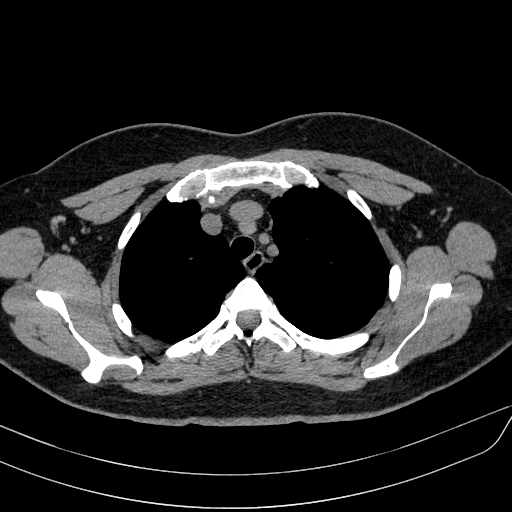
[im 391/496  lung]
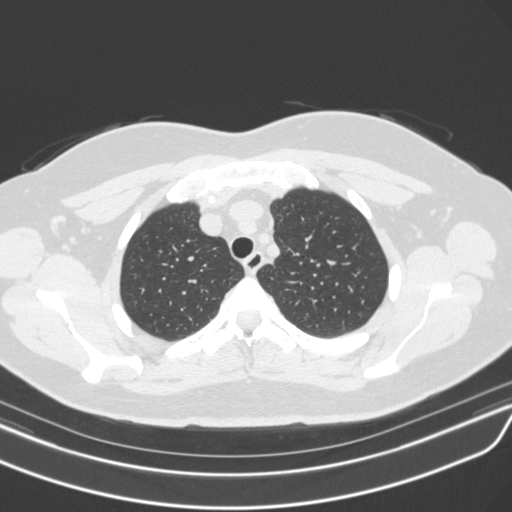
[im 443/496  lung]
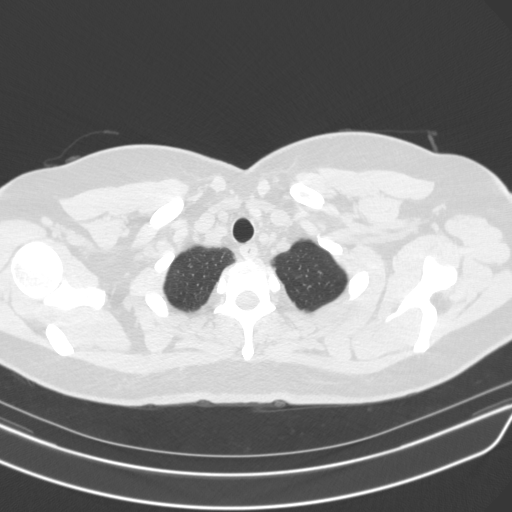
[im 469/496  lung]
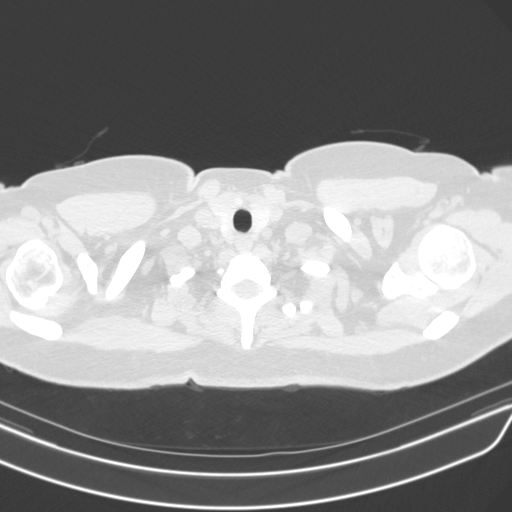

[15 of 32 positions shown; findings below may reference images not displayed]

FINDINGS: Cardiovascular: Normal heart size. No pericardial effusion. No
significant vascular findings.

Mediastinum/Nodes: Small hiatal hernia. Thyroid is unremarkable. No
pathologically enlarged lymph nodes seen in the chest.

Lungs/Pleura: Central airways are patent. No air trapping. No
consolidation, pleural effusion or pneumothorax. No reticular
opacities, traction bronchiectasis or honeycomb change.

Upper Abdomen: No acute abnormality.

Musculoskeletal: No chest wall mass or suspicious bone lesions
identified.
IMPRESSION: Normal chest CT.

## 2023-09-29 ENCOUNTER — Ambulatory Visit (HOSPITAL_COMMUNITY): Admitting: Licensed Clinical Social Worker

## 2023-10-18 ENCOUNTER — Telehealth (HOSPITAL_COMMUNITY): Admitting: Psychiatry

## 2023-11-01 ENCOUNTER — Encounter (HOSPITAL_COMMUNITY): Payer: Self-pay | Admitting: Psychiatry

## 2023-11-01 ENCOUNTER — Telehealth (HOSPITAL_COMMUNITY): Payer: 59 | Admitting: Psychiatry

## 2023-11-01 ENCOUNTER — Telehealth (INDEPENDENT_AMBULATORY_CARE_PROVIDER_SITE_OTHER): Admitting: Psychiatry

## 2023-11-01 DIAGNOSIS — F4001 Agoraphobia with panic disorder: Secondary | ICD-10-CM

## 2023-11-01 DIAGNOSIS — F411 Generalized anxiety disorder: Secondary | ICD-10-CM | POA: Diagnosis not present

## 2023-11-01 MED ORDER — ALPRAZOLAM 0.5 MG PO TABS: 0.5000 mg | ORAL_TABLET | Freq: Every day | ORAL | 1 refills | Status: DC | PRN
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
## 2023-11-10 ENCOUNTER — Ambulatory Visit (HOSPITAL_COMMUNITY): Admitting: Licensed Clinical Social Worker

## 2023-11-10 DIAGNOSIS — F4001 Agoraphobia with panic disorder: Secondary | ICD-10-CM

## 2023-11-10 DIAGNOSIS — F411 Generalized anxiety disorder: Secondary | ICD-10-CM
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
## 2023-11-24 ENCOUNTER — Other Ambulatory Visit: Payer: Self-pay | Admitting: Medical Genetics

## 2024-01-19 ENCOUNTER — Ambulatory Visit (INDEPENDENT_AMBULATORY_CARE_PROVIDER_SITE_OTHER): Admitting: Licensed Clinical Social Worker

## 2024-01-19 DIAGNOSIS — F4001 Agoraphobia with panic disorder: Secondary | ICD-10-CM | POA: Diagnosis not present

## 2024-01-19 DIAGNOSIS — F411 Generalized anxiety disorder: Secondary | ICD-10-CM | POA: Diagnosis not present
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
## 2024-01-26 ENCOUNTER — Other Ambulatory Visit: Payer: Self-pay | Admitting: Medical Genetics

## 2024-01-26 DIAGNOSIS — Z006 Encounter for examination for normal comparison and control in clinical research program: Secondary | ICD-10-CM

## 2024-01-27 ENCOUNTER — Other Ambulatory Visit (HOSPITAL_COMMUNITY): Payer: Self-pay

## 2024-01-31 ENCOUNTER — Telehealth (HOSPITAL_COMMUNITY): Admitting: Psychiatry

## 2024-01-31 ENCOUNTER — Encounter (HOSPITAL_COMMUNITY): Payer: Self-pay | Admitting: Psychiatry

## 2024-01-31 DIAGNOSIS — F411 Generalized anxiety disorder: Secondary | ICD-10-CM | POA: Diagnosis not present

## 2024-01-31 DIAGNOSIS — F4001 Agoraphobia with panic disorder: Secondary | ICD-10-CM

## 2024-01-31 MED ORDER — ALPRAZOLAM 0.5 MG PO TABS: 0.5000 mg | ORAL_TABLET | Freq: Every day | ORAL | 1 refills | Status: DC | PRN

## 2024-01-31 NOTE — Progress Notes (Signed)
 [facility] Follow up visit   Patient Identification: Crystal Alexander MRN:  [id number] Date of Evaluation:  01/31/2024 Referral Source: primary care Chief Complaint: follow up anxiety, panic attacks Visit Diagnosis:    ICD-10-CM   1. GAD (generalized anxiety disorder)  F41.1     2. Panic disorder with agoraphobia  F40.01     Virtual Visit via Video Note  I connected with Crystal Alexander on 01/31/24 at  1:30 PM EDT by a video enabled telemedicine application and verified that I am speaking with the correct person using two identifiers.  Location: Patient: home Provider: home office   I discussed the limitations of evaluation and management by telemedicine and the availability of in person appointments. The patient expressed understanding and agreed to proceed.    I discussed the assessment and treatment plan with the patient. The patient was provided an opportunity to ask questions and all were answered. The patient agreed with the plan and demonstrated an understanding of the instructions.   The patient was advised to call back or seek an in-person evaluation if the symptoms worsen or if the condition fails to improve as anticipated.  I provided 16 minutes of non-face-to-face time during this encounter.     History of Present Illness: Patient is a 43  years old married Caucasian female she works as a teacher, adult education.  Her parents live in the same street she takes care of her mom who has parkinsonism.  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  Other than that she is working remotely and feels that she is progressing slow and steady.  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  Consequences of Substance Abuse: NA  Past Medical History:  Past Medical History:  Diagnosis Date   Abnormal Pap smear of cervix    3/99 cin2 &3   Asthma    Fibroadenoma of breast     breast biopsy x 2   HPV (human papilloma virus) infection    Meniere's disease     Past Surgical History:  Procedure Laterality Date   CERVICAL BIOPSY  W/ LOOP ELECTRODE EXCISION  1999   negative margins   LEEP      Family Psychiatric History: mom: anxiety  Family History:  Family History  Problem Relation Age of Onset   Hypertension Mother    Parkinson's disease Mother    Diabetes Father    Hypertension Paternal Grandmother     Social History:   Social History   Socioeconomic History   Marital status: Married    Spouse name: Not on file   Number of children: Not on file   Years of education: Not on file  Highest education level: Not on file  Occupational History   Not on file  Tobacco Use   Smoking status: Former    Types: Cigarettes   Smokeless tobacco: Never  Substance and Sexual Activity   Alcohol use: Yes    Alcohol/week: 20.0 standard drinks of alcohol    Types: 20 Cans of beer per week    Comment: in a month   Drug use: No   Sexual activity: Yes    Partners: Female    Birth control/protection: None    Comment: Female partner  Other Topics Concern   Not on file  Social History Narrative   Not on file   Social Drivers of Health   Financial  Resource Strain: Low Risk  (01/09/2024)   Received from [facility]   Overall Financial Resource Strain (CARDIA)    How hard is it for you to pay for the very basics like food, housing, medical care, and heating?: Not hard at all  Food Insecurity: No Food Insecurity (01/09/2024)   Received from [facility]   Hunger Vital Sign    Within the past 12 months, you worried that your food would run out before you got the money to buy more.: Never true    Within the past 12 months, the food you bought just didn't last and you didn't have money to get more.: Never true  Transportation Needs: No Transportation Needs (01/09/2024)   Received from [facility] - Transportation    In the past 12 months, has lack of transportation kept you from medical appointments or from getting medications?: No    In the past 12 months, has lack of transportation kept you from meetings, work, or from getting things needed for daily living?: No  Physical Activity: Insufficiently Active (01/09/2024)   Received from [facility]   Exercise Vital Sign    On average, how many days per week do you engage in moderate to strenuous exercise (like a brisk walk)?: 4 days    On average, how many minutes do you engage in exercise at this level?: 30 min  Stress: Stress Concern Present (01/09/2024)   Received from [facility] - Occupational Stress Questionnaire    Do you feel stress - tense, restless, nervous, or anxious, or unable to sleep at night because your mind is troubled all the time - these days?: To some extent  Social Connections: Moderately Integrated (01/09/2024)   Received from [facility]   Social Network    How would you rate your social network (family, work, friends)?: Adequate participation with social networks     Allergies:  No Known Allergies  Metabolic Disorder Labs: Lab Results  Component Value Date   HGBA1C 5.3 12/26/2018   MPG 108 08/21/2015    Lab Results  Component Value Date   PROLACTIN 13.1 08/07/2015   Lab Results  Component Value Date   CHOL 225 (H) 12/26/2018   TRIG 223 (H) 12/26/2018   HDL 43 12/26/2018   CHOLHDL 5.2 (H) 12/26/2018   VLDL 28 08/07/2015   LDLCALC 142 (H) 12/26/2018   LDLCALC 153 (H) 08/07/2015   Lab Results  Component Value Date   TSH 3.240 12/26/2018    Therapeutic Level Labs: No results found for: LITHIUM No results found for: CBMZ No results found for: VALPROATE  Current Medications: Current Outpatient Medications  Medication Sig Dispense Refill   ALPRAZolam  (XANAX ) 0.5 MG tablet Take 1 tablet (0.5 mg  total) by mouth daily as needed for anxiety. 30 tablet 1   famotidine (PEPCID) 20 MG tablet Take 20 mg by mouth at bedtime.     fluticasone (FLOVENT HFA) 110 MCG/ACT inhaler Inhale 1 puff into the lungs in the morning and at bedtime.     metoprolol succinate (TOPROL-XL) 25 MG 24 hr tablet Take 25 mg by mouth daily.     rosuvastatin (CRESTOR) 10 MG tablet Take 10 mg by mouth at bedtime.     No current facility-administered medications for this visit.      Psychiatric Specialty Exam: Review of Systems  Cardiovascular:  Negative for chest pain.  Psychiatric/Behavioral:  Negative for agitation, hallucinations and self-injury. The patient is nervous/anxious.     There were no vitals taken for this visit.There is no height or weight on file to calculate BMI.  General Appearance: Casual  Eye Contact:  Fair  Speech:  Clear and Coherent  Volume:  Normal  Mood: gets anxious  Affect:  Full Range  Thought Process:  Goal Directed  Orientation:  Full (Time, Place, and Person)  Thought Content:  Rumination  Suicidal Thoughts:  No  Homicidal Thoughts:  No  Memory:  Immediate;   Fair Recent;   Fair  Judgement:  Fair  Insight:  Fair  Psychomotor Activity:  Normal  Concentration:  Concentration: Fair and Attention Span: Fair  Recall:  Good  Fund of Knowledge:Good  Language: Good   Akathisia:  No  Handed:   AIMS (if indicated):  not done  Assets:  Communication Skills Desire for Improvement Financial Resources/Insurance Social Support  ADL's:  Intact  Cognition: WNL  Sleep:  variable   Screenings: Insurance Account Manager from 08/21/2020 in [facility] Video Visit from 07/12/2020 in [facility]  PHQ-2 Total Score 2 0  PHQ-9 Total Score 7 --   Flowsheet Row Video Visit from 08/24/2023 in [facility] Video Visit from 02/05/2022 in [facility] Video Visit from 11/06/2021 in [facility]  C-SSRS RISK CATEGORY No Risk No Risk No Risk    Assessment and Plan: as follows Prior documentation reviewed    Panic disorder with agoraphobia; sporadic but handling it better is going out more but with somebody while she is driving.  Continue small dose of Xanax  and also back in therapy with Ronal continue breathing techniques  Also on metoprolol as a beta-blocker for SVT but that also helps with her palpitation  generalized anxiety disorder; Stressed discussed coping skills continue small dose of Xanax    depression : Somewhat but more so anxiety is and continue Xanax  continue coping skills Follow-up in 3 to 4 months months or earlier if needed provided supportive therapy  Fu 34m. Has meds for now Jackey Flight, MD 10/27/20251:46 PM

## 2024-03-08 LAB — GENECONNECT MOLECULAR SCREEN: Genetic Analysis Overall Interpretation: NEGATIVE

## 2024-03-15 ENCOUNTER — Ambulatory Visit (HOSPITAL_COMMUNITY): Admitting: Licensed Clinical Social Worker

## 2024-04-17 ENCOUNTER — Other Ambulatory Visit (HOSPITAL_COMMUNITY): Payer: Self-pay | Admitting: Psychiatry

## 2024-04-17 NOTE — Telephone Encounter (Signed)
 Patient called requesting refill of ALPRAZolam  (XANAX ) 0.5 MG tablet .   [facility] [address] [place], [place] - [address] Phone: [phone]  Fax: [phone]     Last ordered: 01/31/2024 - 30 tablets with 1 refill Last visit: 01/31/2024 Next visit: 05/15/2024

## 2024-04-19 ENCOUNTER — Other Ambulatory Visit (HOSPITAL_COMMUNITY): Payer: Self-pay | Admitting: Psychiatry

## 2024-05-15 ENCOUNTER — Telehealth (HOSPITAL_COMMUNITY): Admitting: Psychiatry

## 2024-05-23 ENCOUNTER — Ambulatory Visit (HOSPITAL_COMMUNITY): Admitting: Licensed Clinical Social Worker
